# Patient Record
Sex: Female | Born: 1965 | Race: Asian | Hispanic: No | Marital: Married | Smoking: Never smoker
Health system: Southern US, Community
[De-identification: ages and names within clinical notes are randomized; demographics above are authoritative.]

## PROBLEM LIST (undated history)

## (undated) HISTORY — PX: TUBAL LIGATION: SHX77

---

## 2017-02-07 ENCOUNTER — Emergency Department (HOSPITAL_BASED_OUTPATIENT_CLINIC_OR_DEPARTMENT_OTHER): Payer: BLUE CROSS/BLUE SHIELD

## 2017-02-07 ENCOUNTER — Encounter (HOSPITAL_BASED_OUTPATIENT_CLINIC_OR_DEPARTMENT_OTHER): Payer: Self-pay | Admitting: *Deleted

## 2017-02-07 ENCOUNTER — Observation Stay (HOSPITAL_BASED_OUTPATIENT_CLINIC_OR_DEPARTMENT_OTHER)
Admission: EM | Admit: 2017-02-07 | Discharge: 2017-02-08 | Disposition: A | Discharge status: Home / Self Care | Payer: BLUE CROSS/BLUE SHIELD | Attending: General Surgery | Admitting: General Surgery

## 2017-02-07 ENCOUNTER — Emergency Department (HOSPITAL_COMMUNITY): Payer: BLUE CROSS/BLUE SHIELD | Admitting: Registered Nurse

## 2017-02-07 ENCOUNTER — Encounter (HOSPITAL_COMMUNITY): Admission: EM | Disposition: A | Discharge status: Home / Self Care | Payer: Self-pay | Source: Home / Self Care

## 2017-02-07 DIAGNOSIS — I700 Atherosclerosis of aorta: Secondary | ICD-10-CM | POA: Insufficient documentation

## 2017-02-07 DIAGNOSIS — D729 Disorder of white blood cells, unspecified: Secondary | ICD-10-CM

## 2017-02-07 DIAGNOSIS — Z9049 Acquired absence of other specified parts of digestive tract: Secondary | ICD-10-CM

## 2017-02-07 DIAGNOSIS — A419 Sepsis, unspecified organism: Secondary | ICD-10-CM

## 2017-02-07 DIAGNOSIS — K3580 Unspecified acute appendicitis: Secondary | ICD-10-CM

## 2017-02-07 DIAGNOSIS — D72828 Other elevated white blood cell count: Secondary | ICD-10-CM | POA: Diagnosis not present

## 2017-02-07 DIAGNOSIS — K353 Acute appendicitis with localized peritonitis: Secondary | ICD-10-CM | POA: Diagnosis not present

## 2017-02-07 DIAGNOSIS — D649 Anemia, unspecified: Secondary | ICD-10-CM | POA: Insufficient documentation

## 2017-02-07 DIAGNOSIS — K37 Unspecified appendicitis: Secondary | ICD-10-CM | POA: Diagnosis present

## 2017-02-07 HISTORY — PX: LAPAROSCOPIC APPENDECTOMY: SHX408

## 2017-02-07 LAB — URINALYSIS, ROUTINE W REFLEX MICROSCOPIC
BILIRUBIN URINE: NEGATIVE
Glucose, UA: NEGATIVE mg/dL
HGB URINE DIPSTICK: NEGATIVE
Ketones, ur: NEGATIVE mg/dL
Nitrite: NEGATIVE
PH: 7 (ref 5.0–8.0)
Protein, ur: NEGATIVE mg/dL
SPECIFIC GRAVITY, URINE: 1.006 (ref 1.005–1.030)

## 2017-02-07 LAB — COMPREHENSIVE METABOLIC PANEL
ALK PHOS: 66 U/L (ref 38–126)
ALT: 22 U/L (ref 14–54)
ANION GAP: 12 (ref 5–15)
AST: 33 U/L (ref 15–41)
Albumin: 3.8 g/dL (ref 3.5–5.0)
BUN: 8 mg/dL (ref 6–20)
CALCIUM: 8.9 mg/dL (ref 8.9–10.3)
CO2: 22 mmol/L (ref 22–32)
Chloride: 103 mmol/L (ref 101–111)
Creatinine, Ser: 1.01 mg/dL — ABNORMAL HIGH (ref 0.44–1.00)
GFR calc non Af Amer: >60 mL/min (ref >60)
Glucose, Bld: 149 mg/dL — ABNORMAL HIGH (ref 65–99)
POTASSIUM: 4 mmol/L (ref 3.5–5.1)
SODIUM: 137 mmol/L (ref 135–145)
Total Bilirubin: 0.7 mg/dL (ref 0.3–1.2)
Total Protein: 7.5 g/dL (ref 6.5–8.1)

## 2017-02-07 LAB — URINALYSIS, MICROSCOPIC (REFLEX): RBC / HPF: NONE SEEN RBC/hpf (ref 0–5)

## 2017-02-07 LAB — CBC WITH DIFFERENTIAL/PLATELET
BASOS ABS: 0 10*3/uL (ref 0.0–0.1)
BASOS PCT: 0 %
Eosinophils Absolute: 0 10*3/uL (ref 0.0–0.7)
Eosinophils Relative: 0 %
HEMATOCRIT: 34.4 % — AB (ref 36.0–46.0)
HEMOGLOBIN: 11.9 g/dL — AB (ref 12.0–15.0)
LYMPHS PCT: 8 %
Lymphs Abs: 1 10*3/uL (ref 0.7–4.0)
MCH: 31.2 pg (ref 26.0–34.0)
MCHC: 34.6 g/dL (ref 30.0–36.0)
MCV: 90.3 fL (ref 78.0–100.0)
MONOS PCT: 1 %
Monocytes Absolute: 0.1 10*3/uL (ref 0.1–1.0)
NEUTROS ABS: 11.2 10*3/uL — AB (ref 1.7–7.7)
NEUTROS PCT: 91 %
Platelets: 159 10*3/uL (ref 150–400)
RBC: 3.81 MIL/uL — ABNORMAL LOW (ref 3.87–5.11)
RDW: 13.8 % (ref 11.5–15.5)
WBC: 12.3 10*3/uL — ABNORMAL HIGH (ref 4.0–10.5)

## 2017-02-07 LAB — PREGNANCY, URINE: Preg Test, Ur: NEGATIVE

## 2017-02-07 LAB — I-STAT CG4 LACTIC ACID, ED
LACTIC ACID, VENOUS: 3.78 mmol/L — AB (ref 0.5–1.9)
LACTIC ACID, VENOUS: 1.04 mmol/L (ref 0.5–1.9)

## 2017-02-07 SURGERY — APPENDECTOMY, LAPAROSCOPIC
Anesthesia: General | Site: Abdomen

## 2017-02-07 MED ORDER — PIPERACILLIN-TAZOBACTAM 3.375 G IVPB 30 MIN
3.3750 g | Freq: Once | INTRAVENOUS | Status: AC
  Administered 2017-02-07: 3.375 g via INTRAVENOUS
  Filled 2017-02-07 (×2): qty 50

## 2017-02-07 MED ORDER — CEFAZOLIN SODIUM-DEXTROSE 2-3 GM-% IV SOLR
INTRAVENOUS | Status: DC | PRN
Start: 2017-02-07 — End: 2017-02-07
  Administered 2017-02-07: 2 g via INTRAVENOUS

## 2017-02-07 MED ORDER — LIDOCAINE HCL (CARDIAC) 20 MG/ML IV SOLN
INTRAVENOUS | Status: DC | PRN
  Administered 2017-02-07: 40 mg via INTRAVENOUS

## 2017-02-07 MED ORDER — ONDANSETRON HCL 4 MG/2ML IJ SOLN
INTRAMUSCULAR | Status: DC | PRN
  Administered 2017-02-07: 4 mg via INTRAVENOUS

## 2017-02-07 MED ORDER — CEFAZOLIN SODIUM-DEXTROSE 2-4 GM/100ML-% IV SOLN: 2.0000 g | INTRAVENOUS | Status: DC

## 2017-02-07 MED ORDER — CHLORHEXIDINE GLUCONATE CLOTH 2 % EX PADS: 6.0000 | MEDICATED_PAD | Freq: Once | CUTANEOUS | Status: DC

## 2017-02-07 MED ORDER — ONDANSETRON HCL 4 MG/2ML IJ SOLN
4.0000 mg | Freq: Once | INTRAMUSCULAR | Status: AC
  Administered 2017-02-07: 4 mg via INTRAVENOUS
  Filled 2017-02-07: qty 2

## 2017-02-07 MED ORDER — OXYCODONE HCL 5 MG PO TABS: 5.0000 mg | ORAL_TABLET | ORAL | Status: DC | PRN

## 2017-02-07 MED ORDER — ACETAMINOPHEN 650 MG RE SUPP: 650.0000 mg | Freq: Four times a day (QID) | RECTAL | Status: DC | PRN

## 2017-02-07 MED ORDER — MIDAZOLAM HCL 2 MG/2ML IJ SOLN
INTRAMUSCULAR | Status: AC
  Filled 2017-02-07: qty 2

## 2017-02-07 MED ORDER — ONDANSETRON HCL 4 MG/2ML IJ SOLN: 4.0000 mg | Freq: Four times a day (QID) | INTRAMUSCULAR | Status: DC | PRN

## 2017-02-07 MED ORDER — FENTANYL CITRATE (PF) 250 MCG/5ML IJ SOLN
INTRAMUSCULAR | Status: AC
  Filled 2017-02-07: qty 5

## 2017-02-07 MED ORDER — SUCCINYLCHOLINE CHLORIDE 20 MG/ML IJ SOLN
INTRAMUSCULAR | Status: DC | PRN
  Administered 2017-02-07: 80 mg via INTRAVENOUS

## 2017-02-07 MED ORDER — MIDAZOLAM HCL 5 MG/5ML IJ SOLN
INTRAMUSCULAR | Status: DC | PRN
  Administered 2017-02-07: 0.5 mg via INTRAVENOUS

## 2017-02-07 MED ORDER — ROCURONIUM BROMIDE 100 MG/10ML IV SOLN
INTRAVENOUS | Status: DC | PRN
  Administered 2017-02-07: 15 mg via INTRAVENOUS

## 2017-02-07 MED ORDER — POTASSIUM CHLORIDE IN NACL 20-0.9 MEQ/L-% IV SOLN
INTRAVENOUS | Status: DC
  Administered 2017-02-07: 21:00:00 via INTRAVENOUS
  Filled 2017-02-07 (×3): qty 1000

## 2017-02-07 MED ORDER — MORPHINE SULFATE (PF) 2 MG/ML IV SOLN: 1.0000 mg | INTRAVENOUS | Status: DC | PRN

## 2017-02-07 MED ORDER — SODIUM CHLORIDE 0.9 % IV BOLUS (SEPSIS)
500.0000 mL | Freq: Once | INTRAVENOUS | Status: AC
  Administered 2017-02-07: 1000 mL via INTRAVENOUS

## 2017-02-07 MED ORDER — ACETAMINOPHEN 10 MG/ML IV SOLN
INTRAVENOUS | Status: AC
  Filled 2017-02-07: qty 100

## 2017-02-07 MED ORDER — IOPAMIDOL (ISOVUE-300) INJECTION 61%
100.0000 mL | Freq: Once | INTRAVENOUS | Status: AC | PRN
  Administered 2017-02-07: 85 mL via INTRAVENOUS

## 2017-02-07 MED ORDER — DEXAMETHASONE SODIUM PHOSPHATE 10 MG/ML IJ SOLN
INTRAMUSCULAR | Status: DC | PRN
  Administered 2017-02-07: 5 mg via INTRAVENOUS

## 2017-02-07 MED ORDER — ACETAMINOPHEN 10 MG/ML IV SOLN
INTRAVENOUS | Status: DC | PRN
  Administered 2017-02-07: 1000 mg via INTRAVENOUS

## 2017-02-07 MED ORDER — BUPIVACAINE HCL (PF) 0.5 % IJ SOLN
INTRAMUSCULAR | Status: DC | PRN
  Administered 2017-02-07: 20 mL

## 2017-02-07 MED ORDER — LACTATED RINGERS IV SOLN
INTRAVENOUS | Status: DC | PRN
  Administered 2017-02-07 (×2): via INTRAVENOUS

## 2017-02-07 MED ORDER — PROMETHAZINE HCL 25 MG/ML IJ SOLN: 6.2500 mg | INTRAMUSCULAR | Status: DC | PRN

## 2017-02-07 MED ORDER — DEXAMETHASONE SODIUM PHOSPHATE 10 MG/ML IJ SOLN
INTRAMUSCULAR | Status: AC
  Filled 2017-02-07: qty 1

## 2017-02-07 MED ORDER — 0.9 % SODIUM CHLORIDE (POUR BTL) OPTIME
TOPICAL | Status: DC | PRN
  Administered 2017-02-07: 1000 mL

## 2017-02-07 MED ORDER — ENOXAPARIN SODIUM 40 MG/0.4ML ~~LOC~~ SOLN
40.0000 mg | SUBCUTANEOUS | Status: DC
  Administered 2017-02-08: 40 mg via SUBCUTANEOUS
  Filled 2017-02-07: qty 0.4

## 2017-02-07 MED ORDER — MORPHINE SULFATE (PF) 4 MG/ML IV SOLN
4.0000 mg | Freq: Once | INTRAVENOUS | Status: AC
  Administered 2017-02-07: 4 mg via INTRAVENOUS
  Filled 2017-02-07: qty 1

## 2017-02-07 MED ORDER — SUGAMMADEX SODIUM 200 MG/2ML IV SOLN
INTRAVENOUS | Status: DC | PRN
  Administered 2017-02-07: 100 mg via INTRAVENOUS

## 2017-02-07 MED ORDER — CEFAZOLIN SODIUM-DEXTROSE 2-4 GM/100ML-% IV SOLN
INTRAVENOUS | Status: AC
  Filled 2017-02-07: qty 100

## 2017-02-07 MED ORDER — ONDANSETRON 4 MG PO TBDP: 4.0000 mg | ORAL_TABLET | Freq: Four times a day (QID) | ORAL | Status: DC | PRN

## 2017-02-07 MED ORDER — ACETAMINOPHEN 325 MG PO TABS: 650.0000 mg | ORAL_TABLET | Freq: Four times a day (QID) | ORAL | Status: DC | PRN

## 2017-02-07 MED ORDER — METRONIDAZOLE IN NACL 5-0.79 MG/ML-% IV SOLN
500.0000 mg | INTRAVENOUS | Status: DC
  Filled 2017-02-07: qty 100

## 2017-02-07 MED ORDER — BUPIVACAINE HCL (PF) 0.5 % IJ SOLN
INTRAMUSCULAR | Status: AC
  Filled 2017-02-07: qty 30

## 2017-02-07 MED ORDER — ONDANSETRON HCL 4 MG/2ML IJ SOLN
INTRAMUSCULAR | Status: AC
  Filled 2017-02-07: qty 2

## 2017-02-07 MED ORDER — SODIUM CHLORIDE 0.9 % IV SOLN
1000.0000 mL | INTRAVENOUS | Status: DC
  Administered 2017-02-07: 1000 mL via INTRAVENOUS

## 2017-02-07 MED ORDER — LACTATED RINGERS IR SOLN
Status: DC | PRN
  Administered 2017-02-07: 2000 mL

## 2017-02-07 MED ORDER — KETOROLAC TROMETHAMINE 15 MG/ML IJ SOLN
INTRAMUSCULAR | Status: DC | PRN
  Administered 2017-02-07: 15 mg via INTRAVENOUS

## 2017-02-07 MED ORDER — METRONIDAZOLE IN NACL 5-0.79 MG/ML-% IV SOLN: 500.0000 mg | INTRAVENOUS | Status: DC

## 2017-02-07 MED ORDER — FENTANYL CITRATE (PF) 100 MCG/2ML IJ SOLN
INTRAMUSCULAR | Status: AC
  Administered 2017-02-07: 19:00:00
  Filled 2017-02-07: qty 2

## 2017-02-07 MED ORDER — SODIUM CHLORIDE 0.9 % IV BOLUS (SEPSIS)
1000.0000 mL | Freq: Once | INTRAVENOUS | Status: AC
  Administered 2017-02-07: 1000 mL via INTRAVENOUS

## 2017-02-07 MED ORDER — PIPERACILLIN-TAZOBACTAM 3.375 G IVPB
3.3750 g | Freq: Three times a day (TID) | INTRAVENOUS | Status: DC
  Filled 2017-02-07: qty 50

## 2017-02-07 MED ORDER — FENTANYL CITRATE (PF) 100 MCG/2ML IJ SOLN
INTRAMUSCULAR | Status: DC | PRN
  Administered 2017-02-07 (×2): 50 ug via INTRAVENOUS

## 2017-02-07 MED ORDER — PIPERACILLIN-TAZOBACTAM 3.375 G IVPB
3.3750 g | Freq: Three times a day (TID) | INTRAVENOUS | Status: DC
  Administered 2017-02-07 – 2017-02-08 (×3): 3.375 g via INTRAVENOUS
  Filled 2017-02-07 (×4): qty 50

## 2017-02-07 MED ORDER — PROPOFOL 10 MG/ML IV BOLUS
INTRAVENOUS | Status: AC
  Filled 2017-02-07: qty 20

## 2017-02-07 MED ORDER — PROPOFOL 10 MG/ML IV BOLUS
INTRAVENOUS | Status: DC | PRN
  Administered 2017-02-07: 80 mg via INTRAVENOUS

## 2017-02-07 MED ORDER — SODIUM CHLORIDE 0.9 % IV BOLUS (SEPSIS): 1000.0000 mL | Freq: Once | INTRAVENOUS | Status: DC

## 2017-02-07 MED ORDER — FENTANYL CITRATE (PF) 100 MCG/2ML IJ SOLN
25.0000 ug | INTRAMUSCULAR | Status: DC | PRN
  Administered 2017-02-07 (×2): 25 ug via INTRAVENOUS

## 2017-02-07 MED ORDER — KETOROLAC TROMETHAMINE 30 MG/ML IJ SOLN: 30.0000 mg | Freq: Once | INTRAMUSCULAR | Status: DC | PRN

## 2017-02-07 SURGICAL SUPPLY — 26 items
APPLIER CLIP 5 13 M/L LIGAMAX5 (MISCELLANEOUS)
CHLORAPREP W/TINT 26ML (MISCELLANEOUS) ×3 IMPLANT
CLIP APPLIE 5 13 M/L LIGAMAX5 (MISCELLANEOUS) IMPLANT
CUTTER FLEX LINEAR 45M (STAPLE) ×3 IMPLANT
DECANTER SPIKE VIAL GLASS SM (MISCELLANEOUS) ×3 IMPLANT
DERMABOND ADVANCED (GAUZE/BANDAGES/DRESSINGS) ×2
DERMABOND ADVANCED .7 DNX12 (GAUZE/BANDAGES/DRESSINGS) ×1 IMPLANT
DRAPE LAPAROSCOPIC ABDOMINAL (DRAPES) ×3 IMPLANT
ELECT REM PT RETURN 15FT ADLT (MISCELLANEOUS) ×3 IMPLANT
GLOVE SURG SIGNA 7.5 PF LTX (GLOVE) ×6 IMPLANT
GOWN STRL REUS W/TWL XL LVL3 (GOWN DISPOSABLE) ×6 IMPLANT
IRRIG SUCT STRYKERFLOW 2 WTIP (MISCELLANEOUS) ×3
IRRIGATION SUCT STRKRFLW 2 WTP (MISCELLANEOUS) ×1 IMPLANT
KIT BASIN OR (CUSTOM PROCEDURE TRAY) ×3 IMPLANT
POUCH SPECIMEN RETRIEVAL 10MM (ENDOMECHANICALS) ×3 IMPLANT
RELOAD 45 VASCULAR/THIN (ENDOMECHANICALS) IMPLANT
RELOAD STAPLE TA45 3.5 REG BLU (ENDOMECHANICALS) ×3 IMPLANT
SHEARS HARMONIC ACE PLUS 36CM (ENDOMECHANICALS) ×3 IMPLANT
SUT MNCRL AB 4-0 PS2 18 (SUTURE) ×3 IMPLANT
TOWEL OR 17X26 10 PK STRL BLUE (TOWEL DISPOSABLE) ×3 IMPLANT
TOWEL OR NON WOVEN STRL DISP B (DISPOSABLE) ×3 IMPLANT
TRAY FOLEY W/METER SILVER 16FR (SET/KITS/TRAYS/PACK) IMPLANT
TRAY LAPAROSCOPIC (CUSTOM PROCEDURE TRAY) ×3 IMPLANT
TROCAR BLADELESS OPT 5 100 (ENDOMECHANICALS) ×3 IMPLANT
TROCAR XCEL BLUNT TIP 100MML (ENDOMECHANICALS) ×3 IMPLANT
TUBING INSUF HEATED (TUBING) ×3 IMPLANT

## 2017-02-07 NOTE — ED Triage Notes (Signed)
[facility], Dr Magnus IvanBlackman in to discuss surgery. Family at bedside to help translate.

## 2017-02-07 NOTE — ED Notes (Signed)
Pt on cardiac monitor and auto VS 

## 2017-02-07 NOTE — Anesthesia Preprocedure Evaluation (Signed)
Anesthesia Evaluation  Patient identified by MRN, date of birth, ID band Patient awake    Reviewed: Allergy & Precautions, NPO status , Patient's Chart, lab work & pertinent test results  Airway Mallampati: II  TM Distance: >3 FB Neck ROM: Full    Dental no notable dental hx.    Pulmonary neg pulmonary ROS,    Pulmonary exam normal breath sounds clear to auscultation       Cardiovascular negative cardio ROS Normal cardiovascular exam Rhythm:Regular Rate:Normal     Neuro/Psych negative neurological ROS  negative psych ROS   GI/Hepatic negative GI ROS, Neg liver ROS,   Endo/Other  negative endocrine ROS  Renal/GU negative Renal ROS  negative genitourinary   Musculoskeletal negative musculoskeletal ROS (+)   Abdominal   Peds negative pediatric ROS (+)  Hematology negative hematology ROS (+)   Anesthesia Other Findings   Reproductive/Obstetrics negative OB ROS                             Anesthesia Physical Anesthesia Plan  ASA: I and emergent  Anesthesia Plan: General   Post-op Pain Management:    Induction: Intravenous and Rapid sequence  PONV Risk Score and Plan: 1 and Ondansetron and Dexamethasone  Airway Management Planned: Oral ETT  Additional Equipment:   Intra-op Plan:   Post-operative Plan: Extubation in OR  Informed Consent: I have reviewed the patients History and Physical, chart, labs and discussed the procedure including the risks, benefits and alternatives for the proposed anesthesia with the patient or authorized representative who has indicated his/her understanding and acceptance.   Dental advisory given  Plan Discussed with: CRNA and Surgeon  Anesthesia Plan Comments:         Anesthesia Quick Evaluation

## 2017-02-07 NOTE — Progress Notes (Signed)
Pharmacy Antibiotic Note  Erin CardKokilaben Beltran is a 51 y.o. female admitted on 02/07/2017 with intra-abdominal infection.  Pharmacy has been consulted for Zosyn dosing. WBC 12.3. Tmax 103.2. LA 3.78.  SCr 1.01/estimated CrCl ~ 45 mL/min.  Zosyn 3.375g IV x1 given per MD.   Plan: Zosyn 3.375g IV q8h (4 hour infusion).  Monitor renal function, culture results and clinical status.   Height: 4\' 9"  (144.8 cm) Weight: 106 lb (48.1 kg) IBW/kg (Calculated) : 38.6  Temp (24hrs), Avg:103.2 F (39.6 C), Min:103.2 F (39.6 C), Max:103.2 F (39.6 C)   Recent Labs Lab 02/07/17 1053 02/07/17 1105  WBC 12.3*  --   LATICACIDVEN  --  3.78*    CrCl cannot be calculated (No order found.).    No Known Allergies  Antimicrobials this admission: Zosyn 7/8 >>  Dose adjustments this admission:   Microbiology results: 7/8 BCx:   Thank you for allowing pharmacy to be a part of this patient's care.  Erin SnufferJessica Natonya Beltran, PharmD, BCPS Clinical Pharmacist Clinical Phone 02/07/2017 until 3:30 PM- [phone] After hours, please call #[phone] 02/07/2017 11:24 AM

## 2017-02-07 NOTE — Anesthesia Procedure Notes (Signed)
Procedure Name: Intubation Date/Time: 02/07/2017 5:58 PM Performed by: Lissa Morales Pre-anesthesia Checklist: Patient identified, Emergency Drugs available, Suction available and Patient being monitored Patient Re-evaluated:Patient Re-evaluated prior to inductionOxygen Delivery Method: Circle system utilized Preoxygenation: Pre-oxygenation with 100% oxygen Intubation Type: IV induction Ventilation: Mask ventilation without difficulty Laryngoscope Size: Mac and 3 Grade View: Grade II Tube type: Oral Tube size: 7.0 mm Number of attempts: 1 Airway Equipment and Method: Stylet and Oral airway Placement Confirmation: ETT inserted through vocal cords under direct vision,  positive ETCO2 and breath sounds checked- equal and bilateral Secured at: 20 cm Tube secured with: Tape Dental Injury: Teeth and Oropharynx as per pre-operative assessment

## 2017-02-07 NOTE — Progress Notes (Signed)
Assumed care from Juanda CrumbleMichael Aleaha Fickling, RN

## 2017-02-07 NOTE — ED Notes (Signed)
Completed 1st bottle of CT contrast

## 2017-02-07 NOTE — ED Provider Notes (Signed)
[facility], was seen there by Trixie DredgeEmily West, PA-C, please see their notes for full documentation of patient's complaint/HPI. Briefly, pt sent here due to appendicitis seen on CT scan. Plan is to have surgical consultation upon arrival here, with Dr. Magnus IvanBlackman. She has received 1.5L bolus NS IVFs, zosyn, zofran, and total of 8mg  morphine. She is declining wanting anything at this time for pain or nausea.    Physical Exam  BP 98/71 (BP Location: Left Arm)   Pulse 89   Temp 98.2 F (36.8 C) (Oral)   Resp (!) 23   Ht 4\' 9"  (1.448 m)   Wt 48.1 kg (106 lb)   SpO2 96%   BMI 22.94 kg/m   Physical Exam Gen: afebrile, BP slightly low 98/71 however stable, NAD HEENT: EOMI, slightly dry lips Resp: no resp distress CV: rate WNL Abd: appearance normal, nondistended, soft, slightly hypoactive BS throughout, with moderate RLQ TTP and slightly tender diffusely throughout but most focally in RLQ, slight voluntary guarding, no rigidity or rebound, neg murphy's, +mcburney's point TTP, no CVA TTP  MsK: moving all extremities with ease Neuro: A&O x4  ED Course  Procedures Results for orders placed or performed during the hospital encounter of 02/07/17  Comprehensive metabolic panel  Result Value Ref Range   Sodium 137 135 - 145 mmol/L   Potassium 4.0 3.5 - 5.1 mmol/L   Chloride 103 101 - 111 mmol/L   CO2 22 22 - 32 mmol/L   Glucose, Bld 149 (H) 65 - 99 mg/dL   BUN 8 6 - 20 mg/dL   Creatinine, Ser 9.601.01 (H) 0.44 - 1.00 mg/dL   Calcium 8.9 8.9 - 45.410.3 mg/dL   Total Protein 7.5 6.5 - 8.1 g/dL   Albumin 3.8 3.5 - 5.0 g/dL   AST 33 15 - 41 U/L   ALT 22 14 - 54 U/L   Alkaline Phosphatase 66 38 - 126 U/L   Total Bilirubin 0.7 0.3 - 1.2 mg/dL   GFR calc non Af Amer >60 >60 mL/min   GFR calc Af Amer >60 >60 mL/min   Anion gap 12 5 - 15  CBC WITH DIFFERENTIAL  Result Value Ref Range   WBC 12.3 (H) 4.0 - 10.5 K/uL   RBC 3.81 (L) 3.87 - 5.11 MIL/uL   Hemoglobin 11.9 (L) 12.0 - 15.0  g/dL   HCT 09.834.4 (L) 11.936.0 - 14.746.0 %   MCV 90.3 78.0 - 100.0 fL   MCH 31.2 26.0 - 34.0 pg   MCHC 34.6 30.0 - 36.0 g/dL   RDW 82.913.8 56.211.5 - 13.015.5 %   Platelets 159 150 - 400 K/uL   Neutrophils Relative % 91 %   Neutro Abs 11.2 (H) 1.7 - 7.7 K/uL   Lymphocytes Relative 8 %   Lymphs Abs 1.0 0.7 - 4.0 K/uL   Monocytes Relative 1 %   Monocytes Absolute 0.1 0.1 - 1.0 K/uL   Eosinophils Relative 0 %   Eosinophils Absolute 0.0 0.0 - 0.7 K/uL   Basophils Relative 0 %   Basophils Absolute 0.0 0.0 - 0.1 K/uL  Urinalysis, Routine w reflex microscopic  Result Value Ref Range   Color, Urine YELLOW YELLOW   APPearance CLEAR CLEAR   Specific Gravity, Urine 1.006 1.005 - 1.030   pH 7.0 5.0 - 8.0   Glucose, UA NEGATIVE NEGATIVE mg/dL   Hgb urine dipstick NEGATIVE NEGATIVE   Bilirubin Urine NEGATIVE NEGATIVE   Ketones, ur NEGATIVE NEGATIVE mg/dL   Protein,  ur NEGATIVE NEGATIVE mg/dL   Nitrite NEGATIVE NEGATIVE   Leukocytes, UA TRACE (A) NEGATIVE  Pregnancy, urine  Result Value Ref Range   Preg Test, Ur NEGATIVE NEGATIVE  Urinalysis, Microscopic (reflex)  Result Value Ref Range   RBC / HPF NONE SEEN 0 - 5 RBC/hpf   WBC, UA 0-5 0 - 5 WBC/hpf   Bacteria, UA MANY (A) NONE SEEN   Squamous Epithelial / LPF 0-5 (A) NONE SEEN  I-Stat CG4 Lactic Acid, ED  (not at  [facility])  Result Value Ref Range   Lactic Acid, Venous 3.78 (HH) 0.5 - 1.9 mmol/L   Comment NOTIFIED PHYSICIAN   I-Stat CG4 Lactic Acid, ED  (not at  [facility])  Result Value Ref Range   Lactic Acid, Venous 1.04 0.5 - 1.9 mmol/L   Dg Chest 2 View  Result Date: 02/07/2017 CLINICAL DATA:  Diffuse abdominal pain with vomiting beginning 2 days ago. Fever. Never smoker. EXAM: CHEST  2 VIEW COMPARISON:  None. FINDINGS: Midline trachea. Normal heart size and mediastinal contours. No pleural effusion or pneumothorax. Mildly low lung volumes. Clear lungs. No free intraperitoneal air. IMPRESSION: No acute cardiopulmonary disease. Electronically Signed   By:  Jeronimo Greaves M.D.   On: 02/07/2017 13:26   Ct Abdomen Pelvis W Contrast  Result Date: 02/07/2017 CLINICAL DATA:  Right lower quadrant pain EXAM: CT ABDOMEN AND PELVIS WITH CONTRAST TECHNIQUE: Multidetector CT imaging of the abdomen and pelvis was performed using the standard protocol following bolus administration of intravenous contrast. CONTRAST:  85mL ISOVUE-300 IOPAMIDOL (ISOVUE-300) INJECTION 61% COMPARISON:  None. FINDINGS: Lower chest: Clear lung bases. Normal heart size without pericardial or pleural effusion. Hepatobiliary: Too small to characterize right hepatic lobe lesion. Focal steatosis adjacent the falciform ligament. Normal gallbladder, without biliary ductal dilatation. Pancreas: Normal, without mass or ductal dilatation. Spleen: Normal in size, without focal abnormality. Adrenals/Urinary Tract: Normal adrenal glands. Normal kidneys, without hydronephrosis. Normal urinary bladder. Stomach/Bowel: Normal stomach, without wall thickening. Normal colon and terminal ileum. The appendix is enlarged, with an appendicolith seen within. Surrounding inflammation, including on image 62/series 2 and at 12 mm on image 40/series 7. No extraluminal gas or periappendiceal fluid collection. Normal small bowel. Vascular/Lymphatic: Aortic atherosclerosis. No abdominopelvic adenopathy. Reproductive: Retroverted uterus.  No adnexal mass. Other: Small volume cul-de-sac fluid could be physiologic or related to the appendix. Musculoskeletal: No acute osseous abnormality. Transitional L5 vertebral body. IMPRESSION: 1. Moderate non complicated appendicitis. 2.  Aortic Atherosclerosis (ICD10-I70.0). These results were called by telephone at the time of interpretation on 02/07/2017 at 1:33 pm to Dr. Karma Ganja , who verbally acknowledged these results. Electronically Signed   By: Jeronimo Greaves M.D.   On: 02/07/2017 13:35     Meds ordered this encounter  Medications  . Calcium-Vitamins C & D (CALCIUM/C/D PO)    Sig: Take by  mouth.  . AND Linked Order Group   . sodium chloride 0.9 % bolus 1,000 mL     Order Specific Question:   Weight basis for 30 mL/kg NS bolus     Answer:   48.1 kg (106 lb)   . sodium chloride 0.9 % bolus 500 mL     Order Specific Question:   Weight basis for 30 mL/kg NS bolus     Answer:   48.1 kg (106 lb)  . 0.9 %  sodium chloride infusion  . piperacillin-tazobactam (ZOSYN) IVPB 3.375 g    Order Specific Question:   Antibiotic Indication:    Answer:   Intra-abdominal  Infection  . piperacillin-tazobactam (ZOSYN) IVPB 3.375 g    Order Specific Question:   Antibiotic Indication:    Answer:   Intra-abdominal Infection  . morphine 4 MG/ML injection 4 mg  . ondansetron (ZOFRAN) injection 4 mg  . iopamidol (ISOVUE-300) 61 % injection 100 mL  . morphine 4 MG/ML injection 4 mg  . sodium chloride 0.9 % bolus 1,000 mL  . ondansetron (ZOFRAN) injection 4 mg     MDM:   ICD-10-CM   1. Acute appendicitis, unspecified acute appendicitis type K35.80   2. Neutrophilic leukocytosis D72.9   3. Sepsis, due to unspecified organism (HCC) A41.9   4. Anemia, unspecified type D64.9    4:30 PM- pt arrived, declines wanting anything for pain; doesn't want any nausea meds but states she might later, PRN zofran ordered for whenever she wants it. Continue NPO status. Dr. Magnus Ivan paged, and is informed of her arrival, he is on his way. Will give more fluids since her pressures are a little soft, although stable in 90s/70s. Abx running. Will continue to monitor while here, until Dr. Magnus Ivan arrives, who will admit pt for appendectomy. Holding orders to be placed by admitting team. Please see their notes for further documentation of care. I appreciate their help with this pleasant pt's care. Pt stable at this time, and will continue to be monitored.   CRITICAL CARE-- appendicitis transfer, with sepsis Performed by: Rhona Raider Total critical care time: 30 minutes Critical care time was exclusive of  separately billable procedures and treating other patients. Critical care was necessary to treat or prevent imminent or life-threatening deterioration. Critical care was time spent personally by me on the following activities: development of treatment plan with patient and/or surrogate as well as nursing, discussions with consultants, evaluation of patient's response to treatment, examination of patient, obtaining history from patient or surrogate, ordering and performing treatments and interventions, ordering and review of laboratory studies, ordering and review of radiographic studies, pulse oximetry and re-evaluation of patient's condition.     [address] [place], [place] 02/07/17 1633    Little, Ambrose Finland, MD 02/08/17 [phone]

## 2017-02-07 NOTE — ED Provider Notes (Signed)
[facility] Note   CSN: [id number] Arrival date & time: 02/07/17  1024     History   Chief Complaint Chief Complaint  Patient presents with  . Fever    HPI Mozell Haber is a 51 y.o. female.  HPI   Daughter translates for patient, declines professional interpreter.  Pt speaks Gujarati.      Pt p/w two days of lower abdominal pain.  The pain began two nights ago, is constant, located on the right side.  Vomited initially, contents of her stomach.  Denies further vomiting.  Denies diarrhea or change in bowel habits, dysuria, urinary frequency or urgency, or vaginal discharge or bleeding.  Past abdominal surgery tubal ligation only.  NPO since 7am when she ate 3 crackers.  Was given tylenol at urgent care where she also had UA that was negative and CBC demonstrating leukocytosis.    History reviewed. No pertinent past medical history.  There are no active problems to display for this patient.   Past Surgical History:  Procedure Laterality Date  . TUBAL LIGATION      OB History    No data available       Home Medications    Prior to Admission medications   Medication Sig Start Date End Date Taking? Authorizing Provider  Calcium-Vitamins C & D (CALCIUM/C/D PO) Take by mouth.   Yes [provider]    Family History No family history on file.  Social History Social History  Substance Use Topics  . Smoking status: Never Smoker  . Smokeless tobacco: Never Used  . Alcohol use No     Allergies   Patient has no known allergies.   Review of Systems Review of Systems  All other systems reviewed and are negative.    Physical Exam Updated Vital Signs BP 103/68 (BP Location: Right Arm)   Pulse 100   Temp 98.2 F (36.8 C) (Oral)   Resp 20   Ht 4\' 9"  (1.448 m)   Wt 48.1 kg (106 lb)   SpO2 99%   BMI 22.94 kg/m   Physical Exam  Constitutional: She appears well-developed and well-nourished. No distress.  HENT:  Head:  Normocephalic and atraumatic.  Mouth/Throat: Oropharynx is clear and moist. No oropharyngeal exudate.  Neck: Normal range of motion. Neck supple.  Cardiovascular: Regular rhythm.  Tachycardia present.   Pulmonary/Chest: Effort normal and breath sounds normal. No respiratory distress. She has no wheezes. She has no rales.  Abdominal: Soft. She exhibits no distension. There is tenderness in the right lower quadrant. There is no rebound and no guarding.  Neurological: She is alert.  Skin: She is not diaphoretic.  Nursing note and vitals reviewed.    ED Treatments / Results  Labs (all labs ordered are listed, but only abnormal results are displayed) Labs Reviewed  COMPREHENSIVE METABOLIC PANEL - Abnormal; Notable for the following:       Result Value   Glucose, Bld 149 (*)    Creatinine, Ser 1.01 (*)    All other components within normal limits  CBC WITH DIFFERENTIAL/PLATELET - Abnormal; Notable for the following:    WBC 12.3 (*)    RBC 3.81 (*)    Hemoglobin 11.9 (*)    HCT 34.4 (*)    Neutro Abs 11.2 (*)    All other components within normal limits  URINALYSIS, ROUTINE W REFLEX MICROSCOPIC - Abnormal; Notable for the following:    Leukocytes, UA TRACE (*)    All other components within  normal limits  URINALYSIS, MICROSCOPIC (REFLEX) - Abnormal; Notable for the following:    Bacteria, UA MANY (*)    Squamous Epithelial / LPF 0-5 (*)    All other components within normal limits  I-STAT CG4 LACTIC ACID, ED - Abnormal; Notable for the following:    Lactic Acid, Venous 3.78 (*)    All other components within normal limits  CULTURE, BLOOD (ROUTINE X 2)  CULTURE, BLOOD (ROUTINE X 2)  PREGNANCY, URINE  I-STAT CG4 LACTIC ACID, ED    EKG  EKG Interpretation  Date/Time:  Sunday February 07 2017 11:10:28 EDT Ventricular Rate:  120 PR Interval:    QRS Duration: 81 QT Interval:  304 QTC Calculation: 430 R Axis:   75 Text Interpretation:  Sinus tachycardia RSR' in V1 or V2, right VCD  or RVH Borderline T abnormalities, anterior leads No old tracing to compare Confirmed by Jerelyn ScottLinker, Martha [phone]([id number]) on 02/07/2017 1:11:35 PM       Radiology Dg Chest 2 View  Result Date: 02/07/2017 CLINICAL DATA:  Diffuse abdominal pain with vomiting beginning 2 days ago. Fever. Never smoker. EXAM: CHEST  2 VIEW COMPARISON:  None. FINDINGS: Midline trachea. Normal heart size and mediastinal contours. No pleural effusion or pneumothorax. Mildly low lung volumes. Clear lungs. No free intraperitoneal air. IMPRESSION: No acute cardiopulmonary disease. Electronically Signed   By: Jeronimo GreavesKyle  Talbot M.D.   On: 02/07/2017 13:26   Ct Abdomen Pelvis W Contrast  Result Date: 02/07/2017 CLINICAL DATA:  Right lower quadrant pain EXAM: CT ABDOMEN AND PELVIS WITH CONTRAST TECHNIQUE: Multidetector CT imaging of the abdomen and pelvis was performed using the standard protocol following bolus administration of intravenous contrast. CONTRAST:  85mL ISOVUE-300 IOPAMIDOL (ISOVUE-300) INJECTION 61% COMPARISON:  None. FINDINGS: Lower chest: Clear lung bases. Normal heart size without pericardial or pleural effusion. Hepatobiliary: Too small to characterize right hepatic lobe lesion. Focal steatosis adjacent the falciform ligament. Normal gallbladder, without biliary ductal dilatation. Pancreas: Normal, without mass or ductal dilatation. Spleen: Normal in size, without focal abnormality. Adrenals/Urinary Tract: Normal adrenal glands. Normal kidneys, without hydronephrosis. Normal urinary bladder. Stomach/Bowel: Normal stomach, without wall thickening. Normal colon and terminal ileum. The appendix is enlarged, with an appendicolith seen within. Surrounding inflammation, including on image 62/series 2 and at 12 mm on image 40/series 7. No extraluminal gas or periappendiceal fluid collection. Normal small bowel. Vascular/Lymphatic: Aortic atherosclerosis. No abdominopelvic adenopathy. Reproductive: Retroverted uterus.  No adnexal mass. Other:  Small volume cul-de-sac fluid could be physiologic or related to the appendix. Musculoskeletal: No acute osseous abnormality. Transitional L5 vertebral body. IMPRESSION: 1. Moderate non complicated appendicitis. 2.  Aortic Atherosclerosis (ICD10-I70.0). These results were called by telephone at the time of interpretation on 02/07/2017 at 1:33 pm to Dr. Karma GanjaLinker , who verbally acknowledged these results. Electronically Signed   By: Jeronimo GreavesKyle  Talbot M.D.   On: 02/07/2017 13:35    Procedures Procedures (including critical care time)  Medications Ordered in ED Medications  0.9 %  sodium chloride infusion (1,000 mLs Intravenous New Bag/Given 02/07/17 1146)  sodium chloride 0.9 % bolus 1,000 mL (not administered)  ondansetron (ZOFRAN) injection 4 mg (not administered)  sodium chloride 0.9 % bolus 1,000 mL (0 mLs Intravenous Stopped 02/07/17 1139)    And  sodium chloride 0.9 % bolus 500 mL (0 mLs Intravenous Stopped 02/07/17 1147)  piperacillin-tazobactam (ZOSYN) IVPB 3.375 g (0 g Intravenous Stopped 02/07/17 1144)  morphine 4 MG/ML injection 4 mg (4 mg Intravenous Given 02/07/17 1235)  ondansetron (ZOFRAN) injection 4  mg (4 mg Intravenous Given 02/07/17 1234)  iopamidol (ISOVUE-300) 61 % injection 100 mL (85 mLs Intravenous Contrast Given 02/07/17 1308)  morphine 4 MG/ML injection 4 mg (4 mg Intravenous Given 02/07/17 1418)     Initial Impression / Assessment and Plan / ED Course  I have reviewed the triage vital signs and the nursing notes.  Pertinent labs & imaging results that were available during my care of the patient were reviewed by me and considered in my medical decision making (see chart for details).  Clinical Course as of Feb 08 1707  Wynelle Link Feb 07, 2017  1357 I spoke with Dr Magnus Ivan, general surgery.  He requests transfer to [facility] ED, page him when she arrives.    [EW]  1401 I spoke with Dr Silverio Lay at [facility] ED to make him aware of the patient.    [EW]    Clinical Course User Index [EW] Trixie Dredge, [place]    Febrile tachycardic pt with lower abdominal pain that began two days ago.  Sepsis protocol in place. Zosyn, IVF given.  CT demonstrates acute appendicitis. Transferred to [facility] Emergency Department to be seen by general surgery (as above).  Patient and family updated on diagnosis and plan, they are in agreement.    Final Clinical Impressions(s) / ED Diagnoses   Final diagnoses:  Acute appendicitis, unspecified acute appendicitis type  Neutrophilic leukocytosis  Sepsis, due to unspecified organism (HCC)  Anemia, unspecified type    New Prescriptions New Prescriptions   No medications on file     Trixie Dredge, Cordelia Poche 02/07/17 1708    Jerelyn Scott, MD 02/11/17 [phone]

## 2017-02-07 NOTE — ED Notes (Signed)
Drinking CT contrast, family at bedside

## 2017-02-07 NOTE — ED Notes (Signed)
Patient transported to CT 

## 2017-02-07 NOTE — ED Notes (Signed)
Remains NPO.

## 2017-02-07 NOTE — Op Note (Signed)
Appendectomy, Lap, Procedure Note  Indications: The patient presented with a history of right-sided abdominal pain. A CT revealed findings consistent with acute appendicitis.  Pre-operative Diagnosis: acute appendicitis  Post-operative Diagnosis: Same  Surgeon: Abigail MiyamotoBLACKMAN,Erin Frankie A   Assistants: 0  Anesthesia: General endotracheal anesthesia  ASA Class: 2  Procedure Details  The patient was seen again in the Holding Room. The risks, benefits, complications, treatment options, and expected outcomes were discussed with the patient and/or family. The possibilities of reaction to medication, perforation of viscus, bleeding, recurrent infection, finding a normal appendix, the need for additional procedures, failure to diagnose a condition, and creating a complication requiring transfusion or operation were discussed. There was concurrence with the proposed plan and informed consent was obtained. The site of surgery was properly noted. The patient was taken to Operating Room, identified as Erin Beltran and the procedure verified as Appendectomy. A Time Out was held and the above information confirmed.  The patient was placed in the supine position and general anesthesia was induced, along with placement of orogastric tube, Venodyne boots, and a Foley catheter. The abdomen was prepped and draped in a sterile fashion. A one centimeter infraumbilical incision was made.  The  midline fascia was incised with a #15 blade.  A Kelly clamp was used to confirm entrance into the peritoneal cavity.  A pursestring suture was passed around the incision with a 0 Vicryl.  The Hasson was introduced into the abdomen and the tails of the suture were used to hold the Hasson in place.   The pneumoperitoneum was then established to steady pressure of 15 mmHg.  Additional 5 mm cannulas then placed in the left lower quadrant of the abdomen and the right upper quadrant region under direct visualization. A careful evaluation  of the entire abdomen was carried out. The patient was placed in Trendelenburg and left lateral decubitus position. The small intestines were retracted in the cephalad and left lateral direction away from the pelvis and right lower quadrant. The patient was found to have an enlarged and inflamed appendix that was extending into the pelvis. There was a lot of turbid fluid in the pelvis and RLQ. The appendix was necrotic.  There was no evidence of perforation.  The appendix was carefully dissected. The appendix was was skeletonized with the harmonic scalpel.   The appendix was divided at its base using an endo-GIA stapler. Minimal appendiceal stump was left in place. There was no evidence of bleeding, leakage, or complication after division of the appendix. Irrigation (2 liters) was also performed and irrigate suctioned from the abdomen as well.  The umbilical port site was closed with the purse string suture. There was no residual palpable fascial defect.  The trocar site skin wounds were closed with 4-0 Monocryl.  Instrument, sponge, and needle counts were correct at the conclusion of the case.   Findings: The appendix was found to be inflamed. There were signs of necrosis.  There was not perforation. There was not abscess formation.  Estimated Blood Loss:  Minimal         Drains:none         Complications:  None; patient tolerated the procedure well.         Disposition: PACU - hemodynamically stable.         Condition: stable

## 2017-02-07 NOTE — ED Notes (Signed)
Declines any medicine for pain. Family at bedside, instructed to please notify nurse if pain increases or would like pain medicine. Verbalized understanding

## 2017-02-07 NOTE — ED Triage Notes (Signed)
Family and patient states the patient developed diffuse abdominal pain with vomiting x3 two days ago.  Pain is associated with fever of unknown amount.  Was seen at Urgent Care today and treated for a temperature of 101, and send here for further evaluation of abdominal pain.

## 2017-02-07 NOTE — Transfer of Care (Signed)
Immediate Anesthesia Transfer of Care Note  Patient: Erin Beltran  Procedure(s) Performed: Procedure(s): APPENDECTOMY LAPAROSCOPIC (N/A)  Patient Location: PACU  Anesthesia Type:General  Level of Consciousness: awake, alert , oriented and patient cooperative  Airway & Oxygen Therapy: Patient Spontanous Breathing and Patient connected to face mask oxygen  Post-op Assessment: Report given to RN, Post -op Vital signs reviewed and stable and Patient moving all extremities X 4  Post vital signs: stable  Last Vitals:  Vitals:   02/07/17 1706 02/07/17 1842  BP: 103/68 102/71  Pulse: 100 (!) 105  Resp: 20   Temp: 36.8 C 37.4 C    Last Pain:  Vitals:   02/07/17 1706  TempSrc: Oral  PainSc:          Complications: No apparent anesthesia complications

## 2017-02-07 NOTE — H&P (Signed)
Erin Beltran is an 51 y.o. female.   Chief Complaint: Lower abdominal pain HPI: This patient presented earlier today to [facility] [place] with a 2 day history of lower abdominal pain. The patient started vaguely Friday and now is referred to the right lower quadrant. She describes it as sharp and severe. She's had nausea and vomiting with this. She is otherwise without complaints. Bowel movements a been normal. She has no prior history of abdominal pain. She has no previous surgical history. She is not speaking much. She refused an official interpreter and water her family to interpret for her who are present in the room.  History reviewed. No pertinent past medical history.  Past Surgical History:  Procedure Laterality Date  . TUBAL LIGATION      No family history on file. Social History:  reports that she has never smoked. She has never used smokeless tobacco. She reports that she does not drink alcohol or use drugs.  Allergies: No Known Allergies   (Not in a hospital admission)  Results for orders placed or performed during the hospital encounter of 02/07/17 (from the past 48 hour(s))  Comprehensive metabolic panel     Status: Abnormal   Collection Time: 02/07/17 10:53 AM  Result Value Ref Range   Sodium 137 135 - 145 mmol/L   Potassium 4.0 3.5 - 5.1 mmol/L   Chloride 103 101 - 111 mmol/L   CO2 22 22 - 32 mmol/L   Glucose, Bld 149 (H) 65 - 99 mg/dL   BUN 8 6 - 20 mg/dL   Creatinine, Ser 1.01 (H) 0.44 - 1.00 mg/dL   Calcium 8.9 8.9 - 10.3 mg/dL   Total Protein 7.5 6.5 - 8.1 g/dL   Albumin 3.8 3.5 - 5.0 g/dL   AST 33 15 - 41 U/L   ALT 22 14 - 54 U/L   Alkaline Phosphatase 66 38 - 126 U/L   Total Bilirubin 0.7 0.3 - 1.2 mg/dL   GFR calc non Af Amer >60 >60 mL/min   GFR calc Af Amer >60 >60 mL/min    Comment: (NOTE) The eGFR has been calculated using the CKD EPI equation. This calculation has not been validated in all clinical situations. eGFR's persistently <60 mL/min  signify possible Chronic Kidney Disease.    Anion gap 12 5 - 15  CBC WITH DIFFERENTIAL     Status: Abnormal   Collection Time: 02/07/17 10:53 AM  Result Value Ref Range   WBC 12.3 (H) 4.0 - 10.5 K/uL   RBC 3.81 (L) 3.87 - 5.11 MIL/uL   Hemoglobin 11.9 (L) 12.0 - 15.0 g/dL   HCT 34.4 (L) 36.0 - 46.0 %   MCV 90.3 78.0 - 100.0 fL   MCH 31.2 26.0 - 34.0 pg   MCHC 34.6 30.0 - 36.0 g/dL   RDW 13.8 11.5 - 15.5 %   Platelets 159 150 - 400 K/uL   Neutrophils Relative % 91 %   Neutro Abs 11.2 (H) 1.7 - 7.7 K/uL   Lymphocytes Relative 8 %   Lymphs Abs 1.0 0.7 - 4.0 K/uL   Monocytes Relative 1 %   Monocytes Absolute 0.1 0.1 - 1.0 K/uL   Eosinophils Relative 0 %   Eosinophils Absolute 0.0 0.0 - 0.7 K/uL   Basophils Relative 0 %   Basophils Absolute 0.0 0.0 - 0.1 K/uL  Urinalysis, Routine w reflex microscopic     Status: Abnormal   Collection Time: 02/07/17 10:53 AM  Result Value Ref Range  Color, Urine YELLOW YELLOW   APPearance CLEAR CLEAR   Specific Gravity, Urine 1.006 1.005 - 1.030   pH 7.0 5.0 - 8.0   Glucose, UA NEGATIVE NEGATIVE mg/dL   Hgb urine dipstick NEGATIVE NEGATIVE   Bilirubin Urine NEGATIVE NEGATIVE   Ketones, ur NEGATIVE NEGATIVE mg/dL   Protein, ur NEGATIVE NEGATIVE mg/dL   Nitrite NEGATIVE NEGATIVE   Leukocytes, UA TRACE (A) NEGATIVE  Pregnancy, urine     Status: None   Collection Time: 02/07/17 10:53 AM  Result Value Ref Range   Preg Test, Ur NEGATIVE NEGATIVE    Comment:        THE SENSITIVITY OF THIS METHODOLOGY IS >20 mIU/mL.   Urinalysis, Microscopic (reflex)     Status: Abnormal   Collection Time: 02/07/17 10:53 AM  Result Value Ref Range   RBC / HPF NONE SEEN 0 - 5 RBC/hpf   WBC, UA 0-5 0 - 5 WBC/hpf   Bacteria, UA MANY (A) NONE SEEN   Squamous Epithelial / LPF 0-5 (A) NONE SEEN  I-Stat CG4 Lactic Acid, ED  (not at  [facility])     Status: Abnormal   Collection Time: 02/07/17 11:05 AM  Result Value Ref Range   Lactic Acid, Venous 3.78 (HH) 0.5 - 1.9  mmol/L   Comment NOTIFIED PHYSICIAN   I-Stat CG4 Lactic Acid, ED  (not at  [facility])     Status: None   Collection Time: 02/07/17  2:08 PM  Result Value Ref Range   Lactic Acid, Venous 1.04 0.5 - 1.9 mmol/L   Dg Chest 2 View  Result Date: 02/07/2017 CLINICAL DATA:  Diffuse abdominal pain with vomiting beginning 2 days ago. Fever. Never smoker. EXAM: CHEST  2 VIEW COMPARISON:  None. FINDINGS: Midline trachea. Normal heart size and mediastinal contours. No pleural effusion or pneumothorax. Mildly low lung volumes. Clear lungs. No free intraperitoneal air. IMPRESSION: No acute cardiopulmonary disease. Electronically Signed   By: Abigail Miyamoto M.D.   On: 02/07/2017 13:26   Ct Abdomen Pelvis W Contrast  Result Date: 02/07/2017 CLINICAL DATA:  Right lower quadrant pain EXAM: CT ABDOMEN AND PELVIS WITH CONTRAST TECHNIQUE: Multidetector CT imaging of the abdomen and pelvis was performed using the standard protocol following bolus administration of intravenous contrast. CONTRAST:  36m ISOVUE-300 IOPAMIDOL (ISOVUE-300) INJECTION 61% COMPARISON:  None. FINDINGS: Lower chest: Clear lung bases. Normal heart size without pericardial or pleural effusion. Hepatobiliary: Too small to characterize right hepatic lobe lesion. Focal steatosis adjacent the falciform ligament. Normal gallbladder, without biliary ductal dilatation. Pancreas: Normal, without mass or ductal dilatation. Spleen: Normal in size, without focal abnormality. Adrenals/Urinary Tract: Normal adrenal glands. Normal kidneys, without hydronephrosis. Normal urinary bladder. Stomach/Bowel: Normal stomach, without wall thickening. Normal colon and terminal ileum. The appendix is enlarged, with an appendicolith seen within. Surrounding inflammation, including on image 62/series 2 and at 12 mm on image 40/series 7. No extraluminal gas or periappendiceal fluid collection. Normal small bowel. Vascular/Lymphatic: Aortic atherosclerosis. No abdominopelvic adenopathy.  Reproductive: Retroverted uterus.  No adnexal mass. Other: Small volume cul-de-sac fluid could be physiologic or related to the appendix. Musculoskeletal: No acute osseous abnormality. Transitional L5 vertebral body. IMPRESSION: 1. Moderate non complicated appendicitis. 2.  Aortic Atherosclerosis (ICD10-I70.0). These results were called by telephone at the time of interpretation on 02/07/2017 at 1:33 pm to Dr. LCanary Brim, who verbally acknowledged these results. Electronically Signed   By: KAbigail MiyamotoM.D.   On: 02/07/2017 13:35    Review of Systems  Constitutional: Positive  for chills and fever.  Gastrointestinal: Positive for abdominal pain, nausea and vomiting. Negative for constipation and diarrhea.    Blood pressure 98/71, pulse 89, temperature 98.2 F (36.8 C), temperature source Oral, resp. rate (!) 23, height _0  (1.448 m), weight 48.1 kg (106 lb), SpO2 96 %. Physical Exam  Constitutional: She is oriented to person, place, and time. She appears well-developed and well-nourished. No distress.  HENT:  Head: Normocephalic and atraumatic.  Right Ear: External ear normal.  Left Ear: External ear normal.  Nose: Nose normal.  Mouth/Throat: Oropharynx is clear and moist.  Eyes: Conjunctivae are normal. Pupils are equal, round, and reactive to light. Right eye exhibits no discharge. Left eye exhibits no discharge. No scleral icterus.  Neck: Normal range of motion. Neck supple. No tracheal deviation present.  Cardiovascular: Normal rate, regular rhythm and normal heart sounds.   Respiratory: Effort normal and breath sounds normal. No respiratory distress. She has no wheezes.  GI: Soft. There is tenderness. There is guarding.  There is tenderness with guarding in the right lower quadrant  Musculoskeletal: Normal range of motion. She exhibits no edema.  Neurological: She is alert and oriented to person, place, and time.  Skin: Skin is warm and dry. No rash noted. She is not diaphoretic.   Psychiatric: Her behavior is normal. Judgment normal.     Assessment/Plan Acute appendicitis  I discussed the diagnosis in detail with the patient through her family. We discussed conservative management with antibiotics versus appendectomy. I recommend laparoscopic appendectomy with removal of the appendix as she does have an appendicolith. I discussed the surgical procedure in detail. I discussed the risk of surgery which includes but is not limited to bleeding, infection, injury to surrounding structures, the need to convert to an open procedure, the need for further surgery, cardiopulmonary issues, DVT, postoperative recovery, etc. They understand and she agrees to proceed with surgery which is scheduled  Habiba Treloar A, MD 02/07/2017, 5:01 PM

## 2017-02-08 ENCOUNTER — Encounter (HOSPITAL_COMMUNITY): Payer: Self-pay | Admitting: Surgery

## 2017-02-08 LAB — BASIC METABOLIC PANEL
ANION GAP: 6 (ref 5–15)
BUN: 11 mg/dL (ref 6–20)
CALCIUM: 7.7 mg/dL — AB (ref 8.9–10.3)
CO2: 24 mmol/L (ref 22–32)
Chloride: 112 mmol/L — ABNORMAL HIGH (ref 101–111)
Creatinine, Ser: 0.74 mg/dL (ref 0.44–1.00)
GFR calc Af Amer: >60 mL/min (ref >60)
Glucose, Bld: 117 mg/dL — ABNORMAL HIGH (ref 65–99)
POTASSIUM: 3.9 mmol/L (ref 3.5–5.1)
SODIUM: 142 mmol/L (ref 135–145)

## 2017-02-08 LAB — CBC
HEMATOCRIT: 27.4 % — AB (ref 36.0–46.0)
HEMOGLOBIN: 9.3 g/dL — AB (ref 12.0–15.0)
MCH: 30.5 pg (ref 26.0–34.0)
MCHC: 33.9 g/dL (ref 30.0–36.0)
MCV: 89.8 fL (ref 78.0–100.0)
Platelets: 120 10*3/uL — ABNORMAL LOW (ref 150–400)
RBC: 3.05 MIL/uL — ABNORMAL LOW (ref 3.87–5.11)
RDW: 14.4 % (ref 11.5–15.5)
WBC: 6.4 10*3/uL (ref 4.0–10.5)

## 2017-02-08 LAB — HEMOGLOBIN: HEMOGLOBIN: 9.3 g/dL — AB (ref 12.0–15.0)

## 2017-02-08 MED ORDER — AMOXICILLIN-POT CLAVULANATE 875-125 MG PO TABS: 1.0000 | ORAL_TABLET | Freq: Two times a day (BID) | ORAL | 0 refills | Status: AC

## 2017-02-08 MED ORDER — ACETAMINOPHEN 325 MG PO TABS: 650.0000 mg | ORAL_TABLET | Freq: Four times a day (QID) | ORAL | Status: AC | PRN

## 2017-02-08 NOTE — Discharge Summary (Signed)
[facility] Discharge Summary   Patient ID: Erin Beltran MRN: [id number] DOB/AGE: 51/13/1967 51 y.o.  Admit date: 02/07/2017 Discharge date: 02/08/2017  Discharge Diagnosis Patient Active Problem List   Diagnosis Date Noted  . Appendicitis 02/07/2017  . S/P appendectomy 02/07/2017    Imaging: Dg Chest 2 View  Result Date: 02/07/2017 CLINICAL DATA:  Diffuse abdominal pain with vomiting beginning 2 days ago. Fever. Never smoker. EXAM: CHEST  2 VIEW COMPARISON:  None. FINDINGS: Midline trachea. Normal heart size and mediastinal contours. No pleural effusion or pneumothorax. Mildly low lung volumes. Clear lungs. No free intraperitoneal air. IMPRESSION: No acute cardiopulmonary disease. Electronically Signed   By: Jeronimo Greaves M.D.   On: 02/07/2017 13:26   Ct Abdomen Pelvis W Contrast  Result Date: 02/07/2017 CLINICAL DATA:  Right lower quadrant pain EXAM: CT ABDOMEN AND PELVIS WITH CONTRAST TECHNIQUE: Multidetector CT imaging of the abdomen and pelvis was performed using the standard protocol following bolus administration of intravenous contrast. CONTRAST:  85mL ISOVUE-300 IOPAMIDOL (ISOVUE-300) INJECTION 61% COMPARISON:  None. FINDINGS: Lower chest: Clear lung bases. Normal heart size without pericardial or pleural effusion. Hepatobiliary: Too small to characterize right hepatic lobe lesion. Focal steatosis adjacent the falciform ligament. Normal gallbladder, without biliary ductal dilatation. Pancreas: Normal, without mass or ductal dilatation. Spleen: Normal in size, without focal abnormality. Adrenals/Urinary Tract: Normal adrenal glands. Normal kidneys, without hydronephrosis. Normal urinary bladder. Stomach/Bowel: Normal stomach, without wall thickening. Normal colon and terminal ileum. The appendix is enlarged, with an appendicolith seen within. Surrounding inflammation, including on image 62/series 2 and at 12 mm on image 40/series 7. No extraluminal gas or periappendiceal fluid  collection. Normal small bowel. Vascular/Lymphatic: Aortic atherosclerosis. No abdominopelvic adenopathy. Reproductive: Retroverted uterus.  No adnexal mass. Other: Small volume cul-de-sac fluid could be physiologic or related to the appendix. Musculoskeletal: No acute osseous abnormality. Transitional L5 vertebral body. IMPRESSION: 1. Moderate non complicated appendicitis. 2.  Aortic Atherosclerosis (ICD10-I70.0). These results were called by telephone at the time of interpretation on 02/07/2017 at 1:33 pm to Dr. Karma Ganja , who verbally acknowledged these results. Electronically Signed   By: Jeronimo Greaves M.D.   On: 02/07/2017 13:35   Procedures Dr. Abigail Miyamoto (02/07/17) - Laparoscopic Appendectomy  Hospital Course:  51 y/o Gujarati speaking female who [facility] to [facility] with 2 days of abdominal pain associated with nausea and vomiting.  Workup significant for acute appendicitis.  Patient was admitted and underwent procedure listed above, where she was found to have a necrotic appendix without perforation.  Tolerated procedure well and was transferred to the floor.  Diet was advanced as tolerated.  On POD#1, the patient was voiding well, tolerating diet, ambulating well, pain well controlled, vital signs stable, incisions c/d/i and felt stable for discharge home.  She will be prescribed 10 days of PO abx at discharge. Patient will follow up in our office in 2 weeks and knows to call with questions or concerns.    Allergies as of 02/08/2017   No Known Allergies     Medication List    TAKE these medications   acetaminophen 325 MG tablet Commonly known as:  TYLENOL Take 2 tablets (650 mg total) by mouth every 6 (six) hours as needed for mild pain, moderate pain or fever (or temp > 100).   amoxicillin-clavulanate 875-125 MG tablet Commonly known as:  AUGMENTIN Take 1 tablet by mouth 2 (two) times daily.   CALCIUM 500+D HIGH POTENCY 500-400 MG-UNIT tablet Generic drug:  calcium-vitamin  D Take 1 tablet by mouth daily.        Follow-up Information    [facility] [place] Call.   Specialty:  General Surgery Why:  as soon as possible to confirm follow up appointment date/time. Contact information: [address] [place] [id number] [phone]          Signed: Hosie SpangleElizabeth Simaan, [facility] 02/08/2017, 3:15 PM Pager: [phone] Consults: [phone] Mon-Fri 7:00 am-4:30 pm Sat-Sun 7:00 am-11:30 am

## 2017-02-08 NOTE — Progress Notes (Signed)
Acute appendicitis  Subjective: Pain controlled, no nausea.  Tolerating clears  Objective: Vital signs in last 24 hours: Temp:  [97.9 F (36.6 C)-103.2 F (39.6 C)] 97.9 F (36.6 C) (07/09 0409) Pulse Rate:  [60-134] 60 (07/09 0409) Resp:  [12-28] 18 (07/09 0409) BP: (93-133)/(59-76) 118/62 (07/09 0409) SpO2:  [95 %-100 %] 97 % (07/09 0409) Weight:  [48.1 kg (106 lb)] 48.1 kg (106 lb) (07/08 1034)    Intake/Output from previous day: 07/08 0701 - 07/09 0700 In: 4000 [I.V.:1850; IV Piggyback:2150] Out: 5 [Blood:5] Intake/Output this shift: No intake/output data recorded.  General appearance: alert and cooperative GI: normal findings: soft, non-distended Incision/Wound:  Lab Results:  Results for orders placed or performed during the hospital encounter of 02/07/17 (from the past 24 hour(s))  Comprehensive metabolic panel     Status: Abnormal   Collection Time: 02/07/17 10:53 AM  Result Value Ref Range   Sodium 137 135 - 145 mmol/L   Potassium 4.0 3.5 - 5.1 mmol/L   Chloride 103 101 - 111 mmol/L   CO2 22 22 - 32 mmol/L   Glucose, Bld 149 (H) 65 - 99 mg/dL   BUN 8 6 - 20 mg/dL   Creatinine, Ser 6.96 (H) 0.44 - 1.00 mg/dL   Calcium 8.9 8.9 - 29.5 mg/dL   Total Protein 7.5 6.5 - 8.1 g/dL   Albumin 3.8 3.5 - 5.0 g/dL   AST 33 15 - 41 U/L   ALT 22 14 - 54 U/L   Alkaline Phosphatase 66 38 - 126 U/L   Total Bilirubin 0.7 0.3 - 1.2 mg/dL   GFR calc non Af Amer >60 >60 mL/min   GFR calc Af Amer >60 >60 mL/min   Anion gap 12 5 - 15  CBC WITH DIFFERENTIAL     Status: Abnormal   Collection Time: 02/07/17 10:53 AM  Result Value Ref Range   WBC 12.3 (H) 4.0 - 10.5 K/uL   RBC 3.81 (L) 3.87 - 5.11 MIL/uL   Hemoglobin 11.9 (L) 12.0 - 15.0 g/dL   HCT 28.4 (L) 13.2 - 44.0 %   MCV 90.3 78.0 - 100.0 fL   MCH 31.2 26.0 - 34.0 pg   MCHC 34.6 30.0 - 36.0 g/dL   RDW 10.2 72.5 - 36.6 %   Platelets 159 150 - 400 K/uL   Neutrophils Relative % 91 %   Neutro Abs 11.2 (H) 1.7 - 7.7 K/uL    Lymphocytes Relative 8 %   Lymphs Abs 1.0 0.7 - 4.0 K/uL   Monocytes Relative 1 %   Monocytes Absolute 0.1 0.1 - 1.0 K/uL   Eosinophils Relative 0 %   Eosinophils Absolute 0.0 0.0 - 0.7 K/uL   Basophils Relative 0 %   Basophils Absolute 0.0 0.0 - 0.1 K/uL  Urinalysis, Routine w reflex microscopic     Status: Abnormal   Collection Time: 02/07/17 10:53 AM  Result Value Ref Range   Color, Urine YELLOW YELLOW   APPearance CLEAR CLEAR   Specific Gravity, Urine 1.006 1.005 - 1.030   pH 7.0 5.0 - 8.0   Glucose, UA NEGATIVE NEGATIVE mg/dL   Hgb urine dipstick NEGATIVE NEGATIVE   Bilirubin Urine NEGATIVE NEGATIVE   Ketones, ur NEGATIVE NEGATIVE mg/dL   Protein, ur NEGATIVE NEGATIVE mg/dL   Nitrite NEGATIVE NEGATIVE   Leukocytes, UA TRACE (A) NEGATIVE  Pregnancy, urine     Status: None   Collection Time: 02/07/17 10:53 AM  Result Value Ref Range   Preg Test, Ur  NEGATIVE NEGATIVE  Urinalysis, Microscopic (reflex)     Status: Abnormal   Collection Time: 02/07/17 10:53 AM  Result Value Ref Range   RBC / HPF NONE SEEN 0 - 5 RBC/hpf   WBC, UA 0-5 0 - 5 WBC/hpf   Bacteria, UA MANY (A) NONE SEEN   Squamous Epithelial / LPF 0-5 (A) NONE SEEN  I-Stat CG4 Lactic Acid, ED  [facility])     Status: Abnormal   Collection Time: 02/07/17 11:05 AM  Result Value Ref Range   Lactic Acid, Venous 3.78 (HH) 0.5 - 1.9 mmol/L   Comment NOTIFIED PHYSICIAN   I-Stat CG4 Lactic Acid, ED  (not at  [facility])     Status: None   Collection Time: 02/07/17  2:08 PM  Result Value Ref Range   Lactic Acid, Venous 1.04 0.5 - 1.9 mmol/L  Basic metabolic panel     Status: Abnormal   Collection Time: 02/08/17  5:22 AM  Result Value Ref Range   Sodium 142 135 - 145 mmol/L   Potassium 3.9 3.5 - 5.1 mmol/L   Chloride 112 (H) 101 - 111 mmol/L   CO2 24 22 - 32 mmol/L   Glucose, Bld 117 (H) 65 - 99 mg/dL   BUN 11 6 - 20 mg/dL   Creatinine, Ser 3.080.74 0.44 - 1.00 mg/dL   Calcium 7.7 (L) 8.9 - 10.3 mg/dL   GFR calc non Af  Amer >60 >60 mL/min   GFR calc Af Amer >60 >60 mL/min   Anion gap 6 5 - 15  CBC     Status: Abnormal   Collection Time: 02/08/17  5:22 AM  Result Value Ref Range   WBC 6.4 4.0 - 10.5 K/uL   RBC 3.05 (L) 3.87 - 5.11 MIL/uL   Hemoglobin 9.3 (L) 12.0 - 15.0 g/dL   HCT 65.727.4 (L) 84.636.0 - 96.246.0 %   MCV 89.8 78.0 - 100.0 fL   MCH 30.5 26.0 - 34.0 pg   MCHC 33.9 30.0 - 36.0 g/dL   RDW 95.214.4 84.111.5 - 32.415.5 %   Platelets 120 (L) 150 - 400 K/uL     Studies/Results Radiology     MEDS, Scheduled . enoxaparin (LOVENOX) injection  40 mg Subcutaneous Q24H     Assessment: POD 1 lap appy.  Appendix severely inflamed   Plan: Advance diet as tolerated Cont IV abx Recheck cbc in AM Probable d/c in AM   LOS: 1 day    Vanita PandaAlicia C Fraser Busche, MD [facility] [place] [phone]   02/08/2017 9:57 AM

## 2017-02-08 NOTE — Anesthesia Postprocedure Evaluation (Signed)
Anesthesia Post Note  Patient: Stormy CardKokilaben Capetillo  Procedure(s) Performed: Procedure(s) (LRB): APPENDECTOMY LAPAROSCOPIC (N/A)     Patient location during evaluation: PACU Anesthesia Type: General Level of consciousness: awake and alert Pain management: pain level controlled Vital Signs Assessment: post-procedure vital signs reviewed and stable Respiratory status: spontaneous breathing, nonlabored ventilation, respiratory function stable and patient connected to nasal cannula oxygen Cardiovascular status: blood pressure returned to baseline and stable Postop Assessment: no signs of nausea or vomiting Anesthetic complications: no    Last Vitals:  Vitals:   02/07/17 1952 02/07/17 2015  BP: 106/62 108/64  Pulse: 92 83  Resp: 15 16  Temp: 37.3 C 36.8 C    Last Pain:  Vitals:   02/07/17 1952  TempSrc:   PainSc: 0-No pain                 Anedra Penafiel S

## 2017-02-08 NOTE — Progress Notes (Signed)
Patient given discharge instructions, and verbalized an understanding of all discharge instructions.  Patient agrees with discharge plan, and is being discharged in stable medical condition.  Patient given transportation via wheelchair. 

## 2017-02-08 NOTE — Discharge Instructions (Signed)

## 2017-02-09 LAB — BLOOD CULTURE ID PANEL (REFLEXED)
Acinetobacter baumannii: NOT DETECTED
CANDIDA KRUSEI: NOT DETECTED
Candida albicans: NOT DETECTED
Candida glabrata: NOT DETECTED
Candida parapsilosis: NOT DETECTED
Candida tropicalis: NOT DETECTED
ENTEROCOCCUS SPECIES: NOT DETECTED
ESCHERICHIA COLI: NOT DETECTED
Enterobacter cloacae complex: NOT DETECTED
Enterobacteriaceae species: NOT DETECTED
HAEMOPHILUS INFLUENZAE: NOT DETECTED
Klebsiella oxytoca: NOT DETECTED
Klebsiella pneumoniae: NOT DETECTED
LISTERIA MONOCYTOGENES: NOT DETECTED
NEISSERIA MENINGITIDIS: NOT DETECTED
PROTEUS SPECIES: NOT DETECTED
PSEUDOMONAS AERUGINOSA: NOT DETECTED
SERRATIA MARCESCENS: NOT DETECTED
STAPHYLOCOCCUS AUREUS BCID: NOT DETECTED
STAPHYLOCOCCUS SPECIES: NOT DETECTED
STREPTOCOCCUS AGALACTIAE: NOT DETECTED
STREPTOCOCCUS PNEUMONIAE: NOT DETECTED
STREPTOCOCCUS PYOGENES: NOT DETECTED
STREPTOCOCCUS SPECIES: DETECTED — AB

## 2017-02-09 NOTE — ED Provider Notes (Signed)
11:36 AM Nursing called to report that they were contacted that the patient had a positive blood culture from her day of presentation 2 days ago. Chart review shows that patient was found to have acute appendicitis and was [facility]. Patient had appendectomy and was then discharged with 10 days of Augmentin.  Review of the patient's chart shows that one set of blood cultures both aerobic and anaerobic bottles had gram-positive cocci. There are no sensitivities completed yet.  Patient will be called to see how she is doing.   1:13 PM Patient was called and her son answer the phone. He reports that she is doing well. She is having minimal abdominal pain and is not having any more infectious type symptoms. He was informed of the finding of positive blood culture and instructed to continue taking the antibiotics. They were instructed to have her call her PCP or her surgery team if she began developing any new or worsened symptoms and if things continue to change or worsen, to return to the emergency department. As the patient is on antibiotics, she will be instructed to continue these and continue her outpatient follow-up plan.   Patient's family had no other questions or concerns. He reports she is doing well.    Emily Forse, Canary Brimhristopher J, MD 02/09/17 1314

## 2017-02-11 LAB — CULTURE, BLOOD (ROUTINE X 2): SPECIAL REQUESTS: ADEQUATE

## 2017-02-12 LAB — CULTURE, BLOOD (ROUTINE X 2): Culture: NO GROWTH

## 2018-07-26 IMAGING — CT CT ABD-PELV W/ CM
2 of 6 series · 16 of 46 positions shown, 18 images · IV contrast (APPLIED)
Comparison: None.

CLINICAL DATA: Right lower quadrant pain

EXAM:
CT ABDOMEN AND PELVIS WITH CONTRAST
TECHNIQUE: Multidetector CT imaging of the abdomen and pelvis was performed
using the standard protocol following bolus administration of
intravenous contrast.
CONTRAST:  85mL PMDHGQ-199 IOPAMIDOL (PMDHGQ-199) INJECTION 61%

[Series 2: axial st · axial · 0.70mm/px · z∈[-377,-17]mm · 13 of 84 slices shown, 15 images]
[im 6/84  soft-tissue]
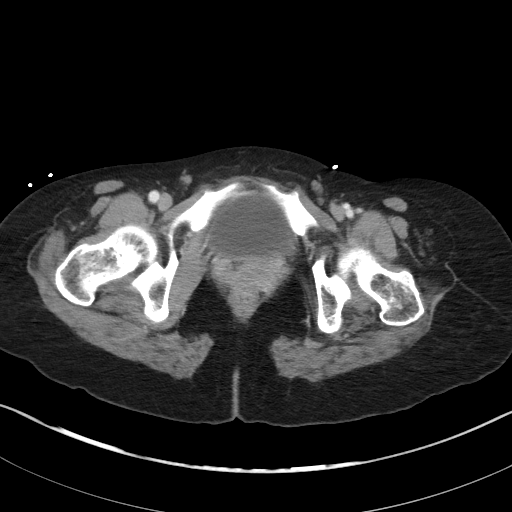
[im 6/84  bone]
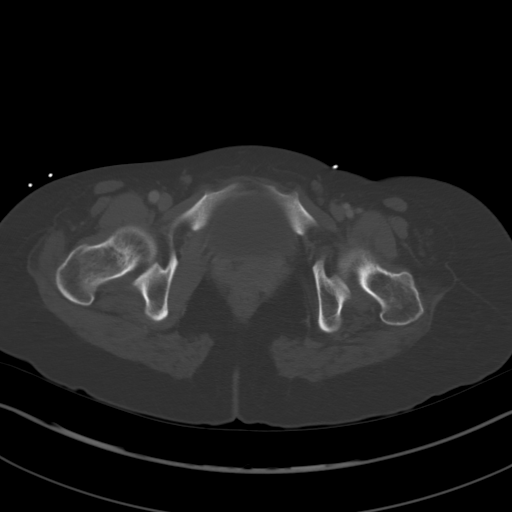
[im 12/84  soft-tissue]
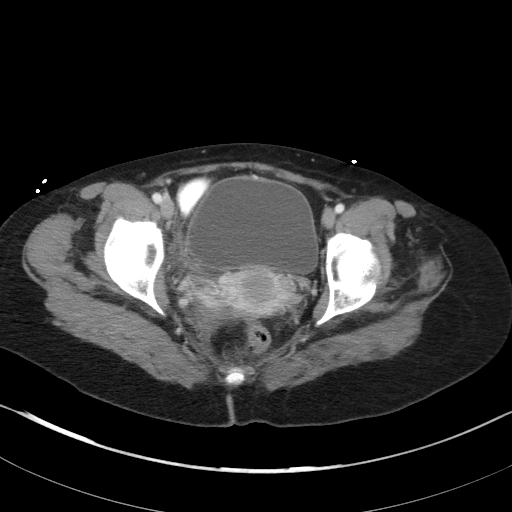
[im 17/84  soft-tissue]
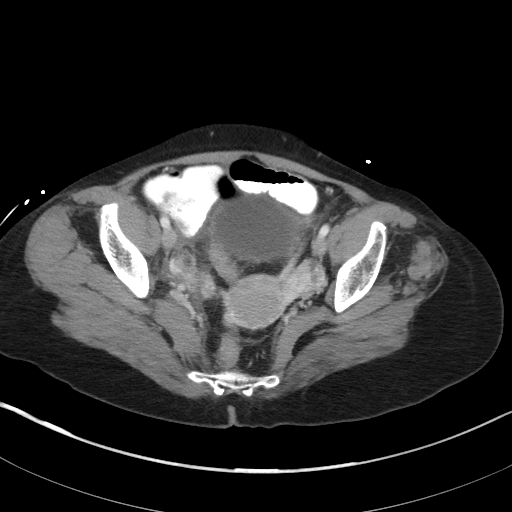
[im 23/84  soft-tissue]
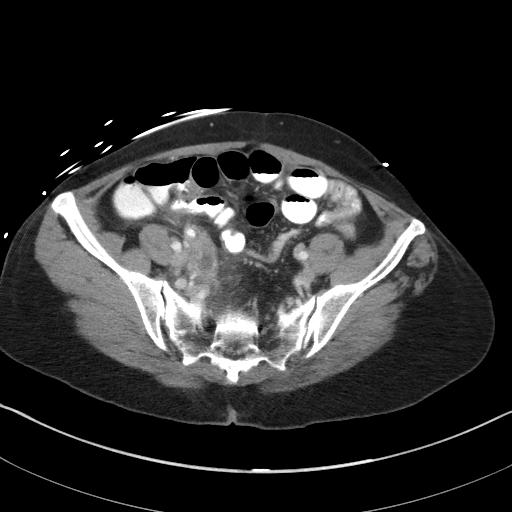
[im 28/84  soft-tissue]
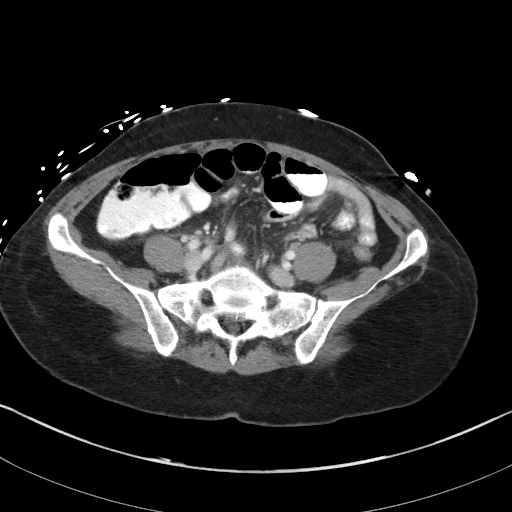
[im 34/84  soft-tissue]
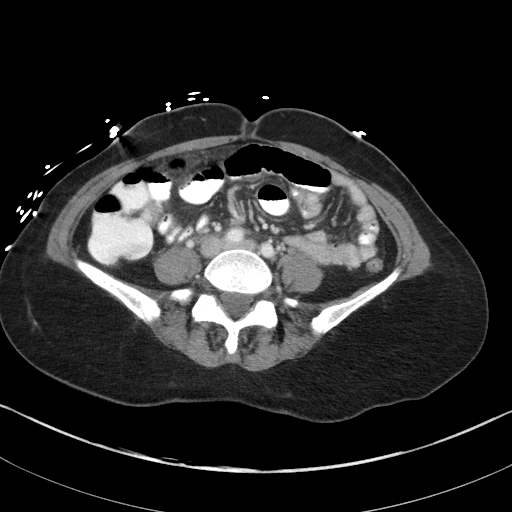
[im 45/84  soft-tissue]
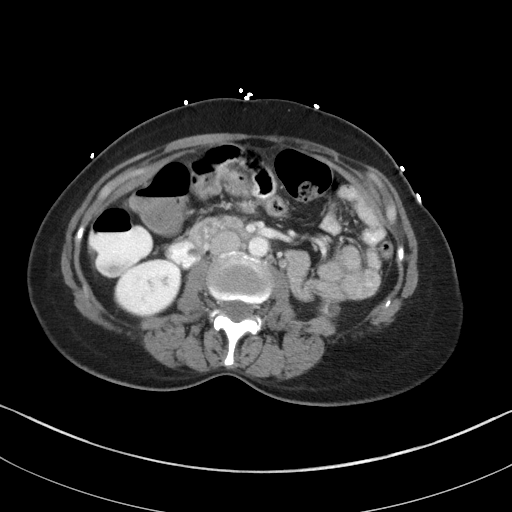
[im 50/84  soft-tissue]
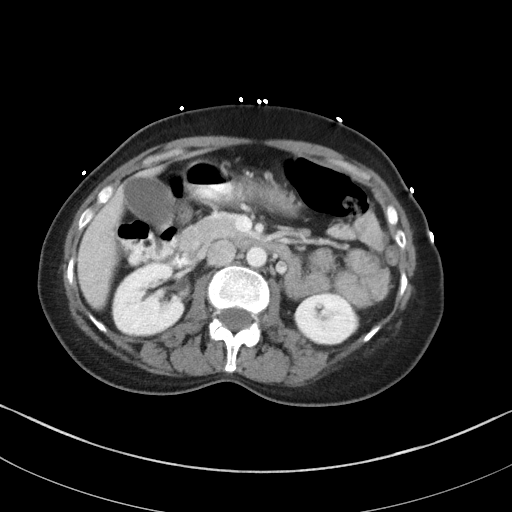
[im 56/84  soft-tissue]
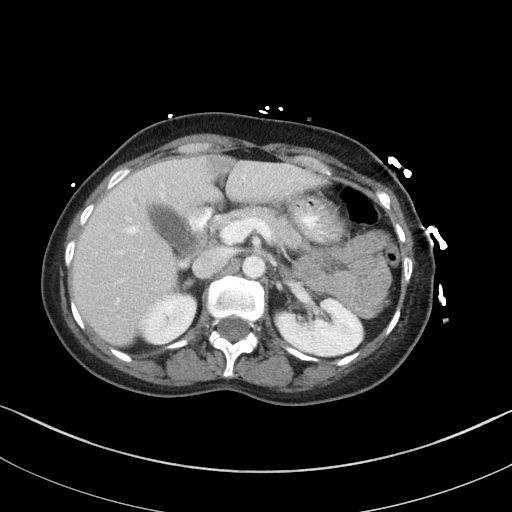
[im 56/84  bone]
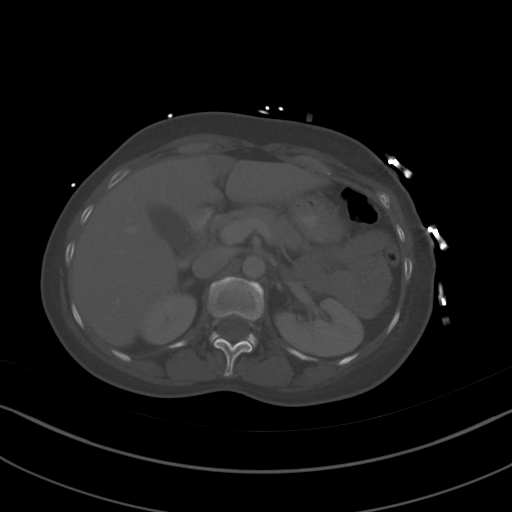
[im 61/84  soft-tissue]
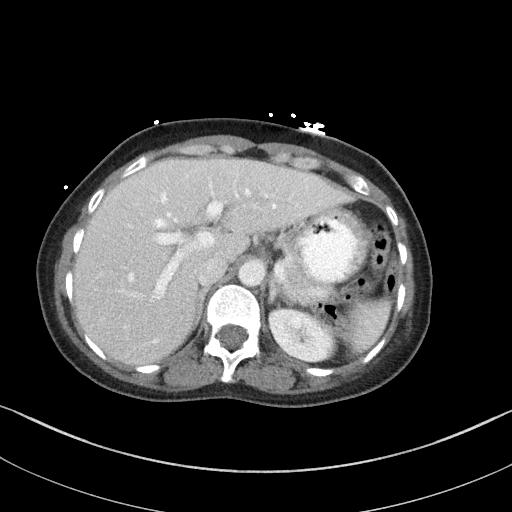
[im 67/84  soft-tissue]
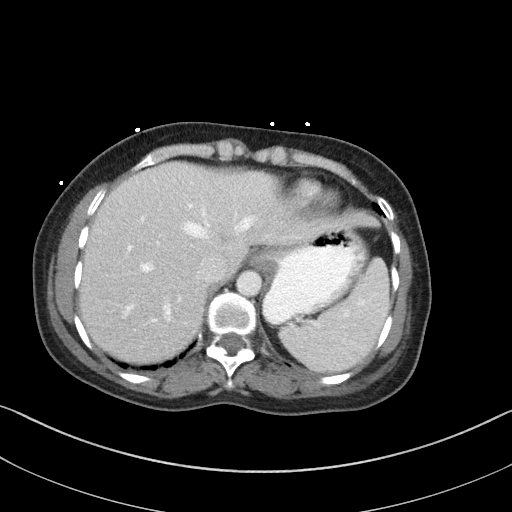
[im 72/84  soft-tissue]
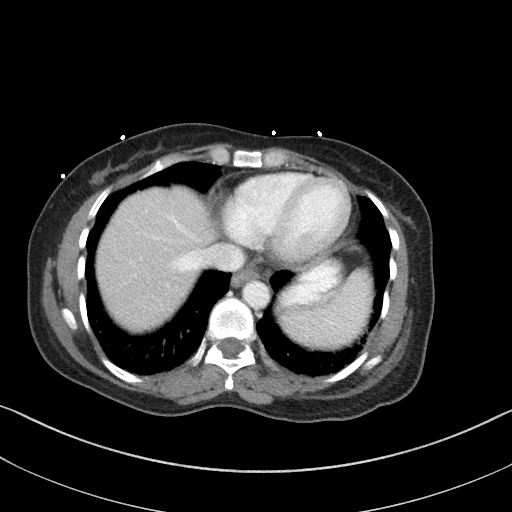
[im 78/84  soft-tissue]
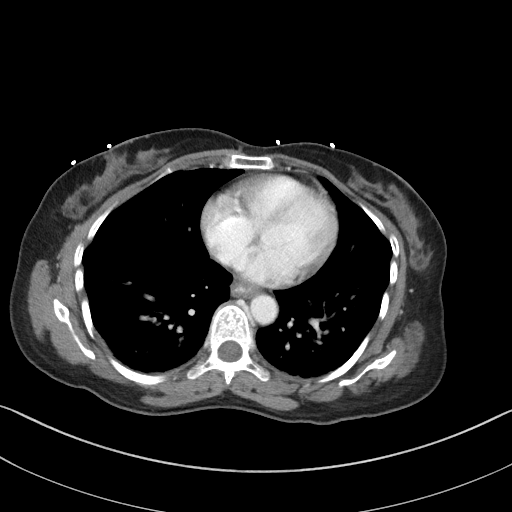

[Series 6: coronal st · coronal · 0.61mm/px · 3 of 69 slices shown]
[im 23/69  soft-tissue]
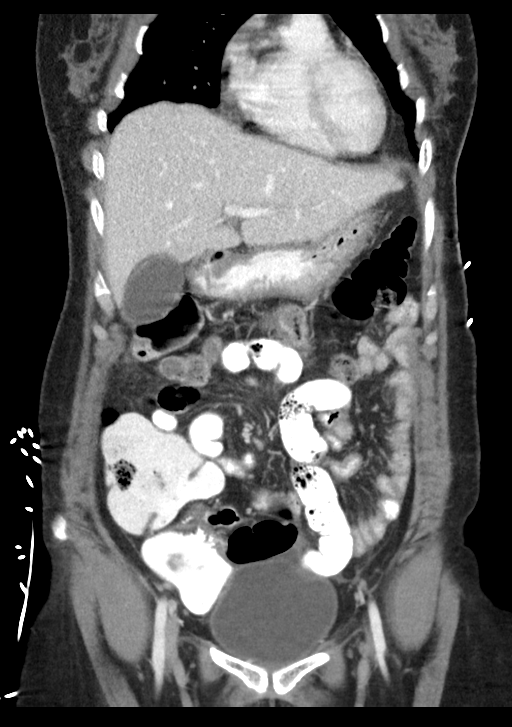
[im 31/69  soft-tissue]
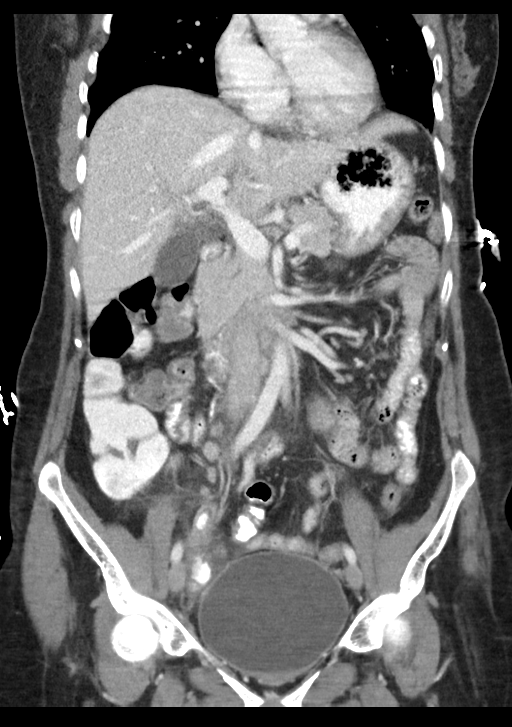
[im 38/69  soft-tissue]
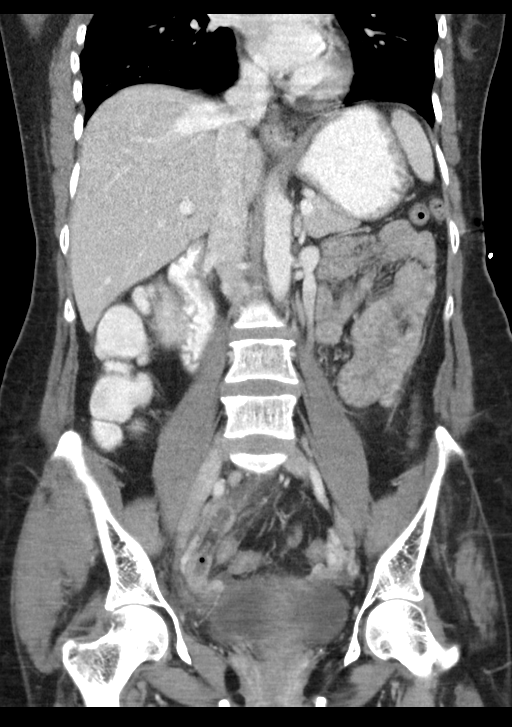

[16 of 46 positions shown; findings below may reference images not displayed]

FINDINGS: Lower chest: Clear lung bases. Normal heart size without pericardial
or pleural effusion.

Hepatobiliary: Too small to characterize right hepatic lobe lesion.
Focal steatosis adjacent the falciform ligament. Normal gallbladder,
without biliary ductal dilatation.

Pancreas: Normal, without mass or ductal dilatation.

Spleen: Normal in size, without focal abnormality.

Adrenals/Urinary Tract: Normal adrenal glands. Normal kidneys,
without hydronephrosis. Normal urinary bladder.

Stomach/Bowel: Normal stomach, without wall thickening. Normal colon
and terminal ileum. The appendix is enlarged, with an appendicolith
seen within. Surrounding inflammation, including on image 62/series
2 and at 12 mm on image 40/series 7. No extraluminal gas or
periappendiceal fluid collection.

Normal small bowel.

Vascular/Lymphatic: Aortic atherosclerosis. No abdominopelvic
adenopathy.

Reproductive: Retroverted uterus.  No adnexal mass.

Other: Small volume cul-de-sac fluid could be physiologic or related
to the appendix.

Musculoskeletal: No acute osseous abnormality. Transitional L5
vertebral body.
IMPRESSION: 1. Moderate non complicated appendicitis.
2.  Aortic Atherosclerosis ([id number]).
These results were called by telephone at the time of interpretation
on 02/07/2017 at [DATE] to Dr. Reetula , who verbally acknowledged
these results.

## 2018-07-26 IMAGING — DX DG CHEST 2V
2 series · 2 of 2 positions shown · non-contrast
Comparison: None.

CLINICAL DATA: Diffuse abdominal pain with vomiting beginning 2
days ago. Fever. Never smoker.

EXAM:
CHEST  2 VIEW

[chest lat]
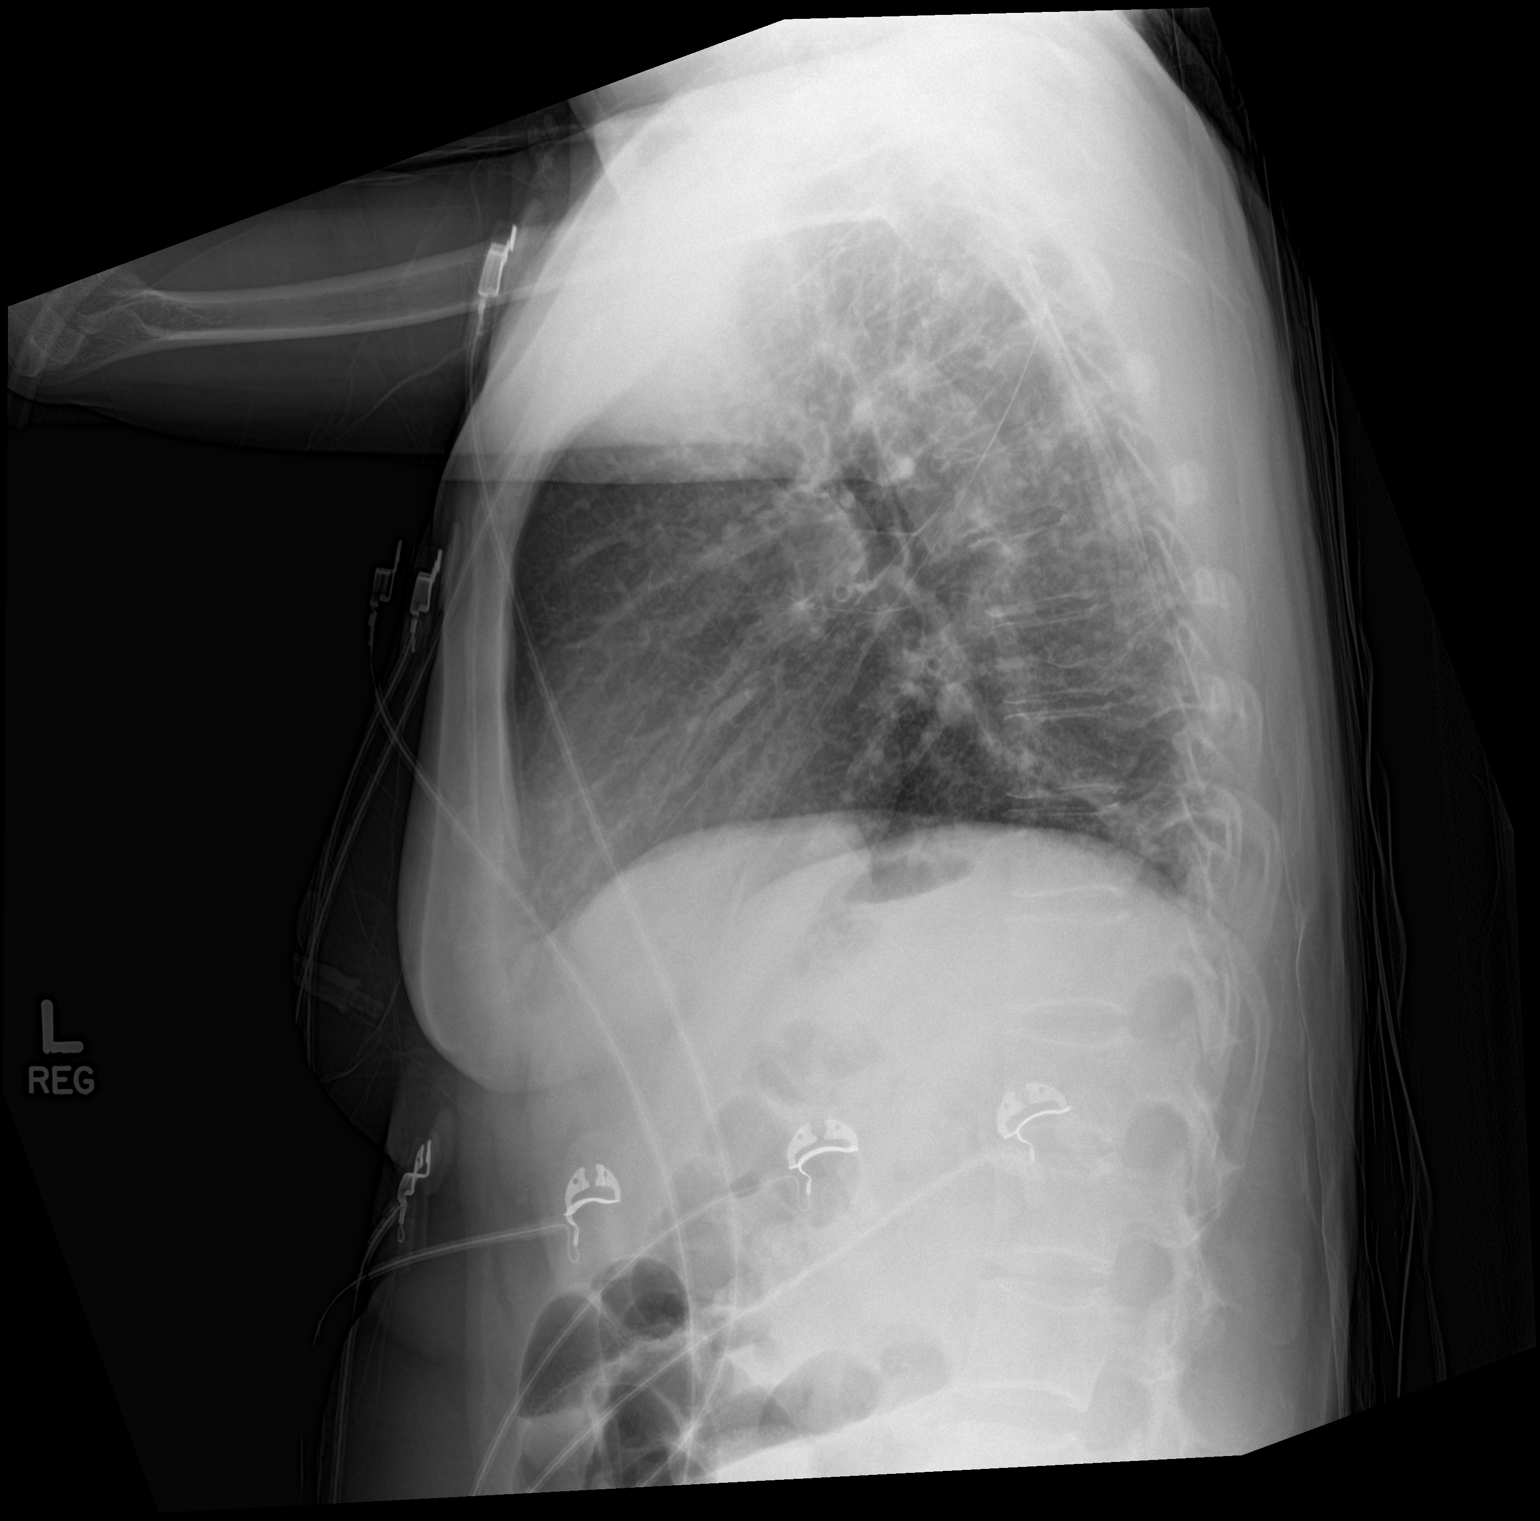

[chest ap strecther]
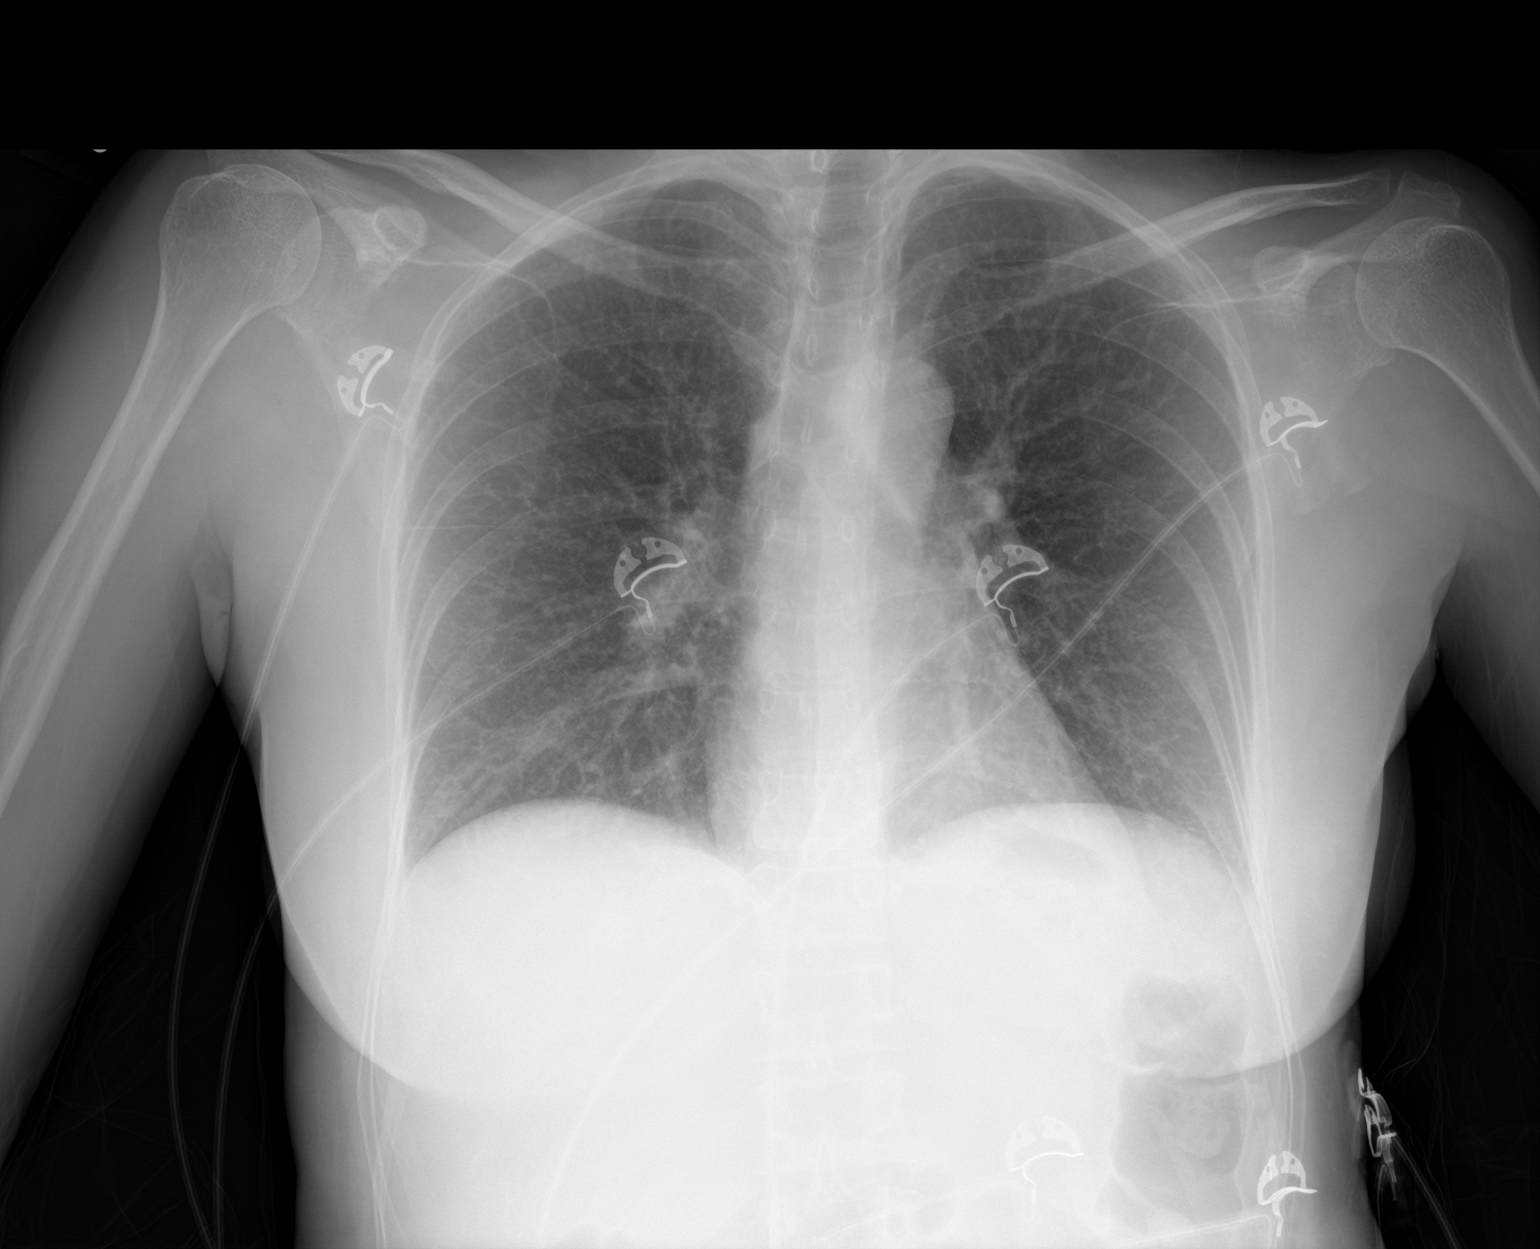

[2 of 2 positions shown; findings below may reference images not displayed]

FINDINGS: Midline trachea. Normal heart size and mediastinal contours. No
pleural effusion or pneumothorax. Mildly low lung volumes. Clear
lungs. No free intraperitoneal air.
IMPRESSION: No acute cardiopulmonary disease.
